# Patient Record
Sex: Female | Born: 1955 | Race: White | Hispanic: No | Smoking: Never smoker
Health system: Southern US, Community
[De-identification: ages and names within clinical notes are randomized; demographics above are authoritative.]

## PROBLEM LIST (undated history)

## (undated) DIAGNOSIS — K56609 Unspecified intestinal obstruction, unspecified as to partial versus complete obstruction: Secondary | ICD-10-CM

## (undated) DIAGNOSIS — K439 Ventral hernia without obstruction or gangrene: Secondary | ICD-10-CM

## (undated) DIAGNOSIS — E039 Hypothyroidism, unspecified: Secondary | ICD-10-CM

## (undated) DIAGNOSIS — E079 Disorder of thyroid, unspecified: Secondary | ICD-10-CM

## (undated) DIAGNOSIS — I10 Essential (primary) hypertension: Secondary | ICD-10-CM

## (undated) DIAGNOSIS — E669 Obesity, unspecified: Secondary | ICD-10-CM

## (undated) DIAGNOSIS — K219 Gastro-esophageal reflux disease without esophagitis: Secondary | ICD-10-CM

## (undated) HISTORY — PX: ABDOMINAL HYSTERECTOMY: SHX81

## (undated) HISTORY — DX: Obesity, unspecified: E66.9

## (undated) HISTORY — DX: Unspecified intestinal obstruction, unspecified as to partial versus complete obstruction: K56.609

## (undated) HISTORY — DX: Ventral hernia without obstruction or gangrene: K43.9

## (undated) HISTORY — DX: Gastro-esophageal reflux disease without esophagitis: K21.9

## (undated) HISTORY — DX: Disorder of thyroid, unspecified: E07.9

---
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
## 2006-07-30 ENCOUNTER — Ambulatory Visit: Payer: Self-pay | Admitting: Internal Medicine

## 2006-08-21 ENCOUNTER — Ambulatory Visit: Payer: Self-pay | Admitting: Unknown Physician Specialty

## 2006-08-28 ENCOUNTER — Ambulatory Visit: Payer: Self-pay | Admitting: Internal Medicine

## 2006-09-27 ENCOUNTER — Ambulatory Visit: Payer: Self-pay | Admitting: Internal Medicine

## 2006-10-07 ENCOUNTER — Ambulatory Visit: Payer: Self-pay | Admitting: Unknown Physician Specialty

## 2006-10-30 ENCOUNTER — Ambulatory Visit: Payer: Self-pay | Admitting: Internal Medicine

## 2006-11-28 ENCOUNTER — Ambulatory Visit: Payer: Self-pay | Admitting: Internal Medicine

## 2006-12-27 ENCOUNTER — Ambulatory Visit: Payer: Self-pay | Admitting: Internal Medicine

## 2011-06-13 ENCOUNTER — Ambulatory Visit: Payer: Self-pay

## 2014-08-02 DIAGNOSIS — R030 Elevated blood-pressure reading, without diagnosis of hypertension: Secondary | ICD-10-CM | POA: Insufficient documentation

## 2014-08-02 DIAGNOSIS — R7303 Prediabetes: Secondary | ICD-10-CM | POA: Insufficient documentation

## 2014-08-02 DIAGNOSIS — K219 Gastro-esophageal reflux disease without esophagitis: Secondary | ICD-10-CM | POA: Insufficient documentation

## 2014-09-27 ENCOUNTER — Ambulatory Visit: Payer: Self-pay | Admitting: Gastroenterology

## 2014-10-04 ENCOUNTER — Ambulatory Visit: Payer: Self-pay | Admitting: Gastroenterology

## 2014-10-12 DIAGNOSIS — R079 Chest pain, unspecified: Secondary | ICD-10-CM | POA: Insufficient documentation

## 2014-10-12 DIAGNOSIS — E782 Mixed hyperlipidemia: Secondary | ICD-10-CM | POA: Insufficient documentation

## 2014-10-12 DIAGNOSIS — I2510 Atherosclerotic heart disease of native coronary artery without angina pectoris: Secondary | ICD-10-CM | POA: Insufficient documentation

## 2014-12-01 DIAGNOSIS — F329 Major depressive disorder, single episode, unspecified: Secondary | ICD-10-CM | POA: Insufficient documentation

## 2014-12-01 DIAGNOSIS — E559 Vitamin D deficiency, unspecified: Secondary | ICD-10-CM | POA: Insufficient documentation

## 2014-12-01 DIAGNOSIS — F32A Depression, unspecified: Secondary | ICD-10-CM | POA: Insufficient documentation

## 2014-12-05 ENCOUNTER — Ambulatory Visit: Payer: Self-pay | Admitting: Internal Medicine

## 2015-02-20 LAB — SURGICAL PATHOLOGY

## 2015-12-07 ENCOUNTER — Other Ambulatory Visit: Payer: Self-pay | Admitting: Internal Medicine

## 2015-12-07 DIAGNOSIS — Z1231 Encounter for screening mammogram for malignant neoplasm of breast: Secondary | ICD-10-CM

## 2015-12-07 DIAGNOSIS — Z6841 Body Mass Index (BMI) 40.0 and over, adult: Secondary | ICD-10-CM | POA: Insufficient documentation

## 2015-12-18 ENCOUNTER — Ambulatory Visit
Admission: RE | Admit: 2015-12-18 | Discharge: 2015-12-18 | Disposition: A | Discharge status: Home / Self Care | Payer: Managed Care, Other (non HMO) | Source: Ambulatory Visit | Attending: Internal Medicine | Admitting: Internal Medicine

## 2015-12-18 DIAGNOSIS — Z1231 Encounter for screening mammogram for malignant neoplasm of breast: Secondary | ICD-10-CM | POA: Diagnosis present

## 2016-01-05 ENCOUNTER — Encounter: Payer: Self-pay | Admitting: Emergency Medicine

## 2016-01-05 ENCOUNTER — Other Ambulatory Visit: Payer: Self-pay

## 2016-01-05 ENCOUNTER — Inpatient Hospital Stay: Admit: 2016-01-05 | Payer: Self-pay | Admitting: Surgery

## 2016-01-05 ENCOUNTER — Encounter
Admission: EM | Disposition: A | Discharge status: Home / Self Care | Payer: Self-pay | Source: Home / Self Care | Attending: Surgery

## 2016-01-05 ENCOUNTER — Emergency Department: Payer: Managed Care, Other (non HMO) | Admitting: Anesthesiology

## 2016-01-05 ENCOUNTER — Emergency Department: Payer: Managed Care, Other (non HMO)

## 2016-01-05 ENCOUNTER — Ambulatory Visit (INDEPENDENT_AMBULATORY_CARE_PROVIDER_SITE_OTHER)
Admit: 2016-01-05 | Discharge: 2016-01-05 | Disposition: A | Discharge status: Home / Self Care | Payer: Managed Care, Other (non HMO) | Attending: Family Medicine | Admitting: Family Medicine

## 2016-01-05 ENCOUNTER — Ambulatory Visit (INDEPENDENT_AMBULATORY_CARE_PROVIDER_SITE_OTHER)
Admission: EM | Admit: 2016-01-05 | Discharge: 2016-01-05 | Disposition: A | Discharge status: Other Acute Inpatient Hospital | Payer: Managed Care, Other (non HMO) | Source: Home / Self Care | Attending: Family Medicine | Admitting: Family Medicine

## 2016-01-05 ENCOUNTER — Inpatient Hospital Stay
Admission: EM | Admit: 2016-01-05 | Discharge: 2016-01-08 | DRG: 354 | Disposition: A | Discharge status: Home / Self Care | Payer: Managed Care, Other (non HMO) | Attending: Surgery | Admitting: Surgery

## 2016-01-05 DIAGNOSIS — K56609 Unspecified intestinal obstruction, unspecified as to partial versus complete obstruction: Secondary | ICD-10-CM

## 2016-01-05 DIAGNOSIS — K5660 Unspecified intestinal obstruction: Secondary | ICD-10-CM

## 2016-01-05 DIAGNOSIS — Z803 Family history of malignant neoplasm of breast: Secondary | ICD-10-CM | POA: Diagnosis not present

## 2016-01-05 DIAGNOSIS — Z7982 Long term (current) use of aspirin: Secondary | ICD-10-CM

## 2016-01-05 DIAGNOSIS — E039 Hypothyroidism, unspecified: Secondary | ICD-10-CM | POA: Diagnosis present

## 2016-01-05 DIAGNOSIS — Z6841 Body Mass Index (BMI) 40.0 and over, adult: Secondary | ICD-10-CM | POA: Diagnosis not present

## 2016-01-05 DIAGNOSIS — K219 Gastro-esophageal reflux disease without esophagitis: Secondary | ICD-10-CM | POA: Diagnosis present

## 2016-01-05 DIAGNOSIS — K436 Other and unspecified ventral hernia with obstruction, without gangrene: Secondary | ICD-10-CM | POA: Diagnosis present

## 2016-01-05 DIAGNOSIS — R109 Unspecified abdominal pain: Secondary | ICD-10-CM

## 2016-01-05 DIAGNOSIS — K460 Unspecified abdominal hernia with obstruction, without gangrene: Secondary | ICD-10-CM

## 2016-01-05 DIAGNOSIS — F329 Major depressive disorder, single episode, unspecified: Secondary | ICD-10-CM | POA: Diagnosis present

## 2016-01-05 DIAGNOSIS — Z79899 Other long term (current) drug therapy: Secondary | ICD-10-CM

## 2016-01-05 DIAGNOSIS — K430 Incisional hernia with obstruction, without gangrene: Secondary | ICD-10-CM | POA: Diagnosis not present

## 2016-01-05 DIAGNOSIS — K660 Peritoneal adhesions (postprocedural) (postinfection): Secondary | ICD-10-CM | POA: Diagnosis present

## 2016-01-05 HISTORY — PX: LAPAROTOMY: SHX154

## 2016-01-05 HISTORY — PX: INGUINAL HERNIA REPAIR: SHX194

## 2016-01-05 LAB — CBC WITH DIFFERENTIAL/PLATELET
Basophils Absolute: 0.1 10*3/uL (ref 0–0.1)
Basophils Relative: 1 %
EOS PCT: 2 %
Eosinophils Absolute: 0.2 10*3/uL (ref 0–0.7)
HCT: 42.2 % (ref 35.0–47.0)
Hemoglobin: 13.5 g/dL (ref 12.0–16.0)
LYMPHS ABS: 1 10*3/uL (ref 1.0–3.6)
LYMPHS PCT: 9 %
MCH: 24 pg — AB (ref 26.0–34.0)
MCHC: 32 g/dL (ref 32.0–36.0)
MCV: 75.2 fL — AB (ref 80.0–100.0)
MONO ABS: 0.7 10*3/uL (ref 0.2–0.9)
Monocytes Relative: 6 %
Neutro Abs: 9.8 10*3/uL — ABNORMAL HIGH (ref 1.4–6.5)
Neutrophils Relative %: 82 %
PLATELETS: 269 10*3/uL (ref 150–440)
RBC: 5.61 MIL/uL — ABNORMAL HIGH (ref 3.80–5.20)
RDW: 18 % — AB (ref 11.5–14.5)
WBC: 11.8 10*3/uL — ABNORMAL HIGH (ref 3.6–11.0)

## 2016-01-05 LAB — AMYLASE: Amylase: 32 U/L (ref 28–100)

## 2016-01-05 LAB — URINALYSIS COMPLETE WITH MICROSCOPIC (ARMC ONLY)
GLUCOSE, UA: NEGATIVE mg/dL
Leukocytes, UA: NEGATIVE
Nitrite: NEGATIVE
PROTEIN: 30 mg/dL — AB
Specific Gravity, Urine: 1.02 (ref 1.005–1.030)
pH: 7.5 (ref 5.0–8.0)

## 2016-01-05 LAB — COMPREHENSIVE METABOLIC PANEL
ALK PHOS: 90 U/L (ref 38–126)
ALT: 19 U/L (ref 14–54)
AST: 18 U/L (ref 15–41)
Albumin: 4.2 g/dL (ref 3.5–5.0)
Anion gap: 7 (ref 5–15)
BUN: 12 mg/dL (ref 6–20)
CHLORIDE: 104 mmol/L (ref 101–111)
CO2: 26 mmol/L (ref 22–32)
CREATININE: 0.85 mg/dL (ref 0.44–1.00)
Calcium: 9.3 mg/dL (ref 8.9–10.3)
GFR calc Af Amer: >60 mL/min (ref >60)
Glucose, Bld: 111 mg/dL — ABNORMAL HIGH (ref 65–99)
Potassium: 4 mmol/L (ref 3.5–5.1)
SODIUM: 137 mmol/L (ref 135–145)
Total Bilirubin: 0.6 mg/dL (ref 0.3–1.2)
Total Protein: 8.1 g/dL (ref 6.5–8.1)

## 2016-01-05 LAB — LIPASE, BLOOD: Lipase: 12 U/L (ref 11–51)

## 2016-01-05 SURGERY — LAPAROTOMY, EXPLORATORY
Anesthesia: General

## 2016-01-05 MED ORDER — SUCCINYLCHOLINE CHLORIDE 20 MG/ML IJ SOLN
INTRAMUSCULAR | Status: DC | PRN
  Administered 2016-01-05: 120 mg via INTRAVENOUS

## 2016-01-05 MED ORDER — BUPIVACAINE-EPINEPHRINE 0.25% -1:200000 IJ SOLN
INTRAMUSCULAR | Status: DC | PRN
  Administered 2016-01-05: 30 mL

## 2016-01-05 MED ORDER — LEVOTHYROXINE SODIUM 100 MCG PO TABS
100.0000 ug | ORAL_TABLET | Freq: Every day | ORAL | Status: DC
  Administered 2016-01-06 – 2016-01-08 (×3): 100 ug via ORAL
  Filled 2016-01-05 (×4): qty 1

## 2016-01-05 MED ORDER — VITAMIN D (ERGOCALCIFEROL) 1.25 MG (50000 UNIT) PO CAPS: 50000.0000 [IU] | ORAL_CAPSULE | ORAL | Status: DC

## 2016-01-05 MED ORDER — FENTANYL CITRATE (PF) 100 MCG/2ML IJ SOLN
INTRAMUSCULAR | Status: DC | PRN
  Administered 2016-01-05: 100 ug via INTRAVENOUS
  Administered 2016-01-05 (×2): 50 ug via INTRAVENOUS
  Administered 2016-01-05: 100 ug via INTRAVENOUS
  Administered 2016-01-05: 50 ug via INTRAVENOUS

## 2016-01-05 MED ORDER — FENTANYL CITRATE (PF) 100 MCG/2ML IJ SOLN
50.0000 ug | Freq: Once | INTRAMUSCULAR | Status: AC
  Administered 2016-01-05: 50 ug via INTRAVENOUS
  Filled 2016-01-05: qty 2

## 2016-01-05 MED ORDER — BUPROPION HCL ER (SR) 150 MG PO TB12
150.0000 mg | ORAL_TABLET | Freq: Two times a day (BID) | ORAL | Status: DC
  Administered 2016-01-06 – 2016-01-08 (×5): 150 mg via ORAL
  Filled 2016-01-05 (×5): qty 1

## 2016-01-05 MED ORDER — LACTATED RINGERS IV SOLN
INTRAVENOUS | Status: DC
  Administered 2016-01-06 – 2016-01-07 (×6): via INTRAVENOUS

## 2016-01-05 MED ORDER — SUGAMMADEX SODIUM 200 MG/2ML IV SOLN
INTRAVENOUS | Status: DC | PRN
  Administered 2016-01-05: 199.6 mg via INTRAVENOUS

## 2016-01-05 MED ORDER — ASPIRIN EC 81 MG PO TBEC
81.0000 mg | DELAYED_RELEASE_TABLET | Freq: Every day | ORAL | Status: DC
  Administered 2016-01-06 – 2016-01-08 (×3): 81 mg via ORAL
  Filled 2016-01-05 (×3): qty 1

## 2016-01-05 MED ORDER — ADULT MULTIVITAMIN W/MINERALS CH
1.0000 | ORAL_TABLET | Freq: Every day | ORAL | Status: DC
  Administered 2016-01-06 – 2016-01-08 (×3): 1 via ORAL
  Filled 2016-01-05 (×3): qty 1

## 2016-01-05 MED ORDER — ONDANSETRON HCL 4 MG/2ML IJ SOLN: 4.0000 mg | Freq: Four times a day (QID) | INTRAMUSCULAR | Status: DC | PRN

## 2016-01-05 MED ORDER — DEXTROSE 5 % IV SOLN
2.0000 g | INTRAVENOUS | Status: DC | PRN
  Administered 2016-01-05: 2 g via INTRAVENOUS

## 2016-01-05 MED ORDER — ONDANSETRON HCL 4 MG/2ML IJ SOLN: 4.0000 mg | Freq: Once | INTRAMUSCULAR | Status: DC | PRN

## 2016-01-05 MED ORDER — ONDANSETRON HCL 4 MG/2ML IJ SOLN
4.0000 mg | Freq: Once | INTRAMUSCULAR | Status: AC
  Administered 2016-01-05: 4 mg via INTRAVENOUS
  Filled 2016-01-05: qty 2

## 2016-01-05 MED ORDER — ONDANSETRON 8 MG PO TBDP
8.0000 mg | ORAL_TABLET | Freq: Once | ORAL | Status: AC
  Administered 2016-01-05: 8 mg via ORAL

## 2016-01-05 MED ORDER — PROPOFOL 10 MG/ML IV BOLUS
INTRAVENOUS | Status: DC | PRN
  Administered 2016-01-05: 200 mg via INTRAVENOUS

## 2016-01-05 MED ORDER — LACTATED RINGERS IV SOLN
INTRAVENOUS | Status: DC | PRN
  Administered 2016-01-05 (×2): via INTRAVENOUS

## 2016-01-05 MED ORDER — FENTANYL CITRATE (PF) 100 MCG/2ML IJ SOLN
25.0000 ug | INTRAMUSCULAR | Status: DC | PRN
  Administered 2016-01-05 (×4): 25 ug via INTRAVENOUS

## 2016-01-05 MED ORDER — PHENYLEPHRINE HCL 10 MG/ML IJ SOLN
INTRAMUSCULAR | Status: DC | PRN
  Administered 2016-01-05: 100 ug via INTRAVENOUS

## 2016-01-05 MED ORDER — OXYCODONE HCL 5 MG PO TABS: 5.0000 mg | ORAL_TABLET | ORAL | Status: DC | PRN

## 2016-01-05 MED ORDER — PANTOPRAZOLE SODIUM 40 MG PO TBEC
40.0000 mg | DELAYED_RELEASE_TABLET | Freq: Every day | ORAL | Status: DC
  Administered 2016-01-06 – 2016-01-08 (×4): 40 mg via ORAL
  Filled 2016-01-05 (×4): qty 1

## 2016-01-05 MED ORDER — SODIUM CHLORIDE 0.9 % IV BOLUS (SEPSIS)
1000.0000 mL | Freq: Once | INTRAVENOUS | Status: AC
  Administered 2016-01-05: 1000 mL via INTRAVENOUS

## 2016-01-05 MED ORDER — MORPHINE SULFATE (PF) 4 MG/ML IV SOLN
4.0000 mg | Freq: Once | INTRAVENOUS | Status: AC | PRN
  Administered 2016-01-05: 4 mg via INTRAVENOUS
  Filled 2016-01-05: qty 1

## 2016-01-05 MED ORDER — ROCURONIUM BROMIDE 100 MG/10ML IV SOLN
INTRAVENOUS | Status: DC | PRN
  Administered 2016-01-05: 40 mg via INTRAVENOUS
  Administered 2016-01-05: 20 mg via INTRAVENOUS
  Administered 2016-01-05: 10 mg via INTRAVENOUS

## 2016-01-05 MED ORDER — LIDOCAINE HCL (CARDIAC) 20 MG/ML IV SOLN
INTRAVENOUS | Status: DC | PRN
  Administered 2016-01-05: 60 mg via INTRAVENOUS

## 2016-01-05 MED ORDER — ONDANSETRON HCL 4 MG/2ML IJ SOLN
INTRAMUSCULAR | Status: AC
  Administered 2016-01-05: 4 mg via INTRAVENOUS
  Filled 2016-01-05: qty 2

## 2016-01-05 MED ORDER — IOHEXOL 350 MG/ML SOLN
125.0000 mL | Freq: Once | INTRAVENOUS | Status: AC | PRN
  Administered 2016-01-05: 125 mL via INTRAVENOUS

## 2016-01-05 MED ORDER — HEPARIN SODIUM (PORCINE) 5000 UNIT/ML IJ SOLN
5000.0000 [IU] | Freq: Three times a day (TID) | INTRAMUSCULAR | Status: DC
  Administered 2016-01-06 – 2016-01-08 (×7): 5000 [IU] via SUBCUTANEOUS
  Filled 2016-01-05 (×7): qty 1

## 2016-01-05 MED ORDER — ONDANSETRON HCL 4 MG PO TABS
4.0000 mg | ORAL_TABLET | Freq: Four times a day (QID) | ORAL | Status: DC | PRN
  Administered 2016-01-06: 4 mg via ORAL
  Filled 2016-01-05: qty 1

## 2016-01-05 MED ORDER — ONDANSETRON HCL 4 MG/2ML IJ SOLN
INTRAMUSCULAR | Status: DC | PRN
  Administered 2016-01-05: 10 mg via INTRAVENOUS

## 2016-01-05 MED ORDER — ONDANSETRON HCL 4 MG/2ML IJ SOLN
4.0000 mg | Freq: Once | INTRAMUSCULAR | Status: AC
  Administered 2016-01-05: 4 mg via INTRAVENOUS

## 2016-01-05 MED ORDER — BUPIVACAINE-EPINEPHRINE (PF) 0.25% -1:200000 IJ SOLN
INTRAMUSCULAR | Status: AC
  Filled 2016-01-05: qty 30

## 2016-01-05 MED ORDER — HYDROMORPHONE HCL 1 MG/ML IJ SOLN
1.0000 mg | INTRAMUSCULAR | Status: DC | PRN
  Administered 2016-01-05 – 2016-01-06 (×2): 1 mg via INTRAVENOUS
  Filled 2016-01-05 (×2): qty 1

## 2016-01-05 SURGICAL SUPPLY — 31 items
CANISTER SUCT 1200ML W/VALVE (MISCELLANEOUS) ×4 IMPLANT
CATH TRAY 16F METER LATEX (MISCELLANEOUS) ×4 IMPLANT
CHLORAPREP W/TINT 26ML (MISCELLANEOUS) ×8 IMPLANT
DRAPE LAPAROTOMY 100X77 ABD (DRAPES) ×4 IMPLANT
DRSG TELFA 3X8 NADH (GAUZE/BANDAGES/DRESSINGS) ×4 IMPLANT
ELECT REM PT RETURN 9FT ADLT (ELECTROSURGICAL) ×4
ELECTRODE REM PT RTRN 9FT ADLT (ELECTROSURGICAL) ×2 IMPLANT
GAUZE SPONGE 4X4 12PLY STRL (GAUZE/BANDAGES/DRESSINGS) ×4 IMPLANT
GLOVE BIO SURGEON STRL SZ8 (GLOVE) ×24 IMPLANT
GOWN STRL REUS W/ TWL LRG LVL3 (GOWN DISPOSABLE) ×4 IMPLANT
GOWN STRL REUS W/TWL LRG LVL3 (GOWN DISPOSABLE) ×4
KIT RM TURNOVER STRD PROC AR (KITS) ×4 IMPLANT
LABEL OR SOLS (LABEL) ×4 IMPLANT
NDL SAFETY 22GX1.5 (NEEDLE) ×4 IMPLANT
NS IRRIG 1000ML POUR BTL (IV SOLUTION) ×4 IMPLANT
PACK BASIN MAJOR ARMC (MISCELLANEOUS) ×4 IMPLANT
PACK COLON CLEAN CLOSURE (MISCELLANEOUS) ×4 IMPLANT
SEPRAFILM MEMBRANE 5X6 (MISCELLANEOUS) ×4 IMPLANT
SPONGE LAP 18X18 5 PK (GAUZE/BANDAGES/DRESSINGS) ×4 IMPLANT
STAPLER SKIN PROX 35W (STAPLE) ×4 IMPLANT
SUT PDS AB 1 TP1 54 (SUTURE) ×4 IMPLANT
SUT PROLENE 0 CT 1 30 (SUTURE) ×12 IMPLANT
SUT PROLENE 0 CTX CR/8 (SUTURE) ×8 IMPLANT
SUT SILK 0 SH 30 (SUTURE) ×4 IMPLANT
SUT SILK 3-0 (SUTURE) IMPLANT
SUT VIC AB 0 CT1 36 (SUTURE) ×4 IMPLANT
SUT VIC AB 3-0 SH 27 (SUTURE)
SUT VIC AB 3-0 SH 27X BRD (SUTURE) IMPLANT
SUT VICRYL 2 0 18  UND BR (SUTURE)
SUT VICRYL 2 0 18 UND BR (SUTURE) IMPLANT
SYRINGE 10CC LL (SYRINGE) ×8 IMPLANT

## 2016-01-05 NOTE — Anesthesia Preprocedure Evaluation (Addendum)
Anesthesia Evaluation  Patient identified by MRN, date of birth, ID band Patient awake    Reviewed: Allergy & Precautions, NPO status , Patient's Chart, lab work & pertinent test results  Airway Mallampati: III       Dental  (+) Teeth Intact   Pulmonary neg pulmonary ROS,    breath sounds clear to auscultation       Cardiovascular Exercise Tolerance: Good  Rhythm:Regular Rate:Normal     Neuro/Psych negative neurological ROS     GI/Hepatic Neg liver ROS, GERD  Medicated,  Endo/Other  Hypothyroidism Morbid obesity  Renal/GU negative Renal ROS     Musculoskeletal negative musculoskeletal ROS (+)   Abdominal (+) + obese,   Peds  Hematology negative hematology ROS (+)   Anesthesia Other Findings   Reproductive/Obstetrics                            Anesthesia Physical Anesthesia Plan  ASA: II and emergent  Anesthesia Plan: General   Post-op Pain Management:    Induction: Intravenous  Airway Management Planned: Oral ETT  Additional Equipment:   Intra-op Plan:   Post-operative Plan: Extubation in OR  Informed Consent: I have reviewed the patients History and Physical, chart, labs and discussed the procedure including the risks, benefits and alternatives for the proposed anesthesia with the patient or authorized representative who has indicated his/her understanding and acceptance.     Plan Discussed with: CRNA  Anesthesia Plan Comments:         Anesthesia Quick Evaluation

## 2016-01-05 NOTE — Op Note (Signed)
Abdominal Hernia Repair  Pre-operative Diagnosis: Incarcerated ventral hernia with bowel obstruction  Post-operative Diagnosis: Same  Surgeon: Adah Salvageichard E. Excell Seltzerooper, MD FACS  Anesthesia: Gen. with endotracheal tube  Assistant: Surgical tech  Procedure: Exploratory laparotomy and repair of incarcerated ventral hernia primarily  Procedure Details patient was admitted from the emergency room with an incarcerated ventral hernia The patient was seen again in the Holding Room. The benefits, complications, treatment options, and expected outcomes were discussed with the patient. The risks of bleeding, infection, recurrence of symptoms, failure to resolve symptoms, bowel injury, mesh placement, mesh infection, any of which could require further surgery were reviewed with the patient. I discussed in great detail the high risk of mesh infection if mesh is utilized and the potential for primary closure with its risk of recurrence versus bio mesh U utilization The likelihood of improving the patient's symptoms with return to their baseline status is good.  The patient and/or family concurred with the proposed plan, giving informed consent.  The patient was taken to Operating Room, identified as Bridget Williams and the procedure verified.  A Time Out was held and the above information confirmed.  Prior to the induction of general anesthesia, antibiotic prophylaxis was administered. VTE prophylaxis was in place. General endotracheal anesthesia was then administered and tolerated well. After the induction, the abdomen was prepped with Chloraprep and draped in the sterile fashion. The patient was positioned in the supine position.  A midline incision was utilized to open and explore the subcutaneous tissues tissues a very large hernia sac was identified and it was opened showing viable colon and viable omentum. This was chronically incarcerated in these incarcerations were taken down with electrocautery. Due to the  very small size of the ventral hernia which was ultimately sized at 2-2-1/2 cm, there was tremendous hypertension in the venous structures of the omentum this required suturing with 0 silk suture ligatures or silk ties for control of hemorrhage and the omentum. Because of the very small rent that was identified this went required enlargement both cephalad and caudad for a great distance in order to reduce the large amount of colon and omentum. The colon was filled with constipated stool balls signifying a very chronic condition. Once this was complete it was noted that the venous hypertension of the omentum had been relieved being placed back in the abdomen and all bleeding stopped. However at this point there was probably 150-200 cc of blood loss from the omental bleeding venous structures that required suturing  It was decided that because the hernia rent itself was so small and because of the risk of infection the wound would be closed primarily out the use of prosthetic mesh. This is done by once assuring that hemostasis was adequate and the sponge lap needle count was correct at the Seprafilm was placed understanding that there was a higher risk of recurrence and reoperation. The wound was then closed with figure-of-eight 0 Prolene sutures tied individually adequately repaired the hernia and closed the fascial edges.  Marcaine was infiltrated into the skin and subcutaneous tissues for a total of 30 cc and then a deep running suture in 2 layers of 20 and 0 Vicryl was placed. Skin staples were placed a sterile dressing was utilized.  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 Excell Seltzer, MD, FACS

## 2016-01-05 NOTE — H&P (Signed)
Bridget Williams is an 60 y.o. female.    Chief Complaint: abd pain  HPI: abd pain, periumb with N/V, no BM since Tuesday. Pain started Tuesday, unable to work No prior episode; has had lap assist hysterectomy  Has thyroid disease and GERD  History reviewed. No pertinent past medical history.  Past Surgical History  Procedure Laterality Date  . Abdominal hysterectomy    . Breast biopsy Bilateral 1978    neg    Family History  Problem Relation Age of Onset  . Breast cancer Mother 48  . Breast cancer Paternal Aunt    Social History:  reports that she has never smoked. She does not have any smokeless tobacco history on file. She reports that she does not drink alcohol. Her drug history is not on file. Works at [employer] Allergies: No Known Allergies   (Not in a hospital admission)   Review of Systems  Constitutional: Negative for fever, chills and weight loss.  HENT: Negative.   Eyes: Negative.   Respiratory: Negative.   Cardiovascular: Negative.   Gastrointestinal: Positive for nausea, vomiting, abdominal pain and constipation. Negative for heartburn, diarrhea, blood in stool and melena.  Genitourinary: Negative.   Musculoskeletal: Negative.   Skin: Negative.   Neurological: Negative.   Endo/Heme/Allergies: Negative.   Psychiatric/Behavioral: Negative.      Physical Exam:  BP 158/93 mmHg  Pulse 98  Temp(Src) 98.3 F (36.8 C) (Oral)  Resp 19  Ht 5' (1.524 m)  Wt 220 lb (99.791 kg)  BMI 42.97 kg/m2  SpO2 97%  Physical Exam  Constitutional: She is oriented to person, place, and time. She appears distressed.  Morbidly obese comfortable-appearing  HENT:  Head: Normocephalic and atraumatic.  Mouth/Throat: No oropharyngeal exudate.  Eyes: Pupils are equal, round, and reactive to light. Right eye exhibits no discharge. Left eye exhibits no discharge. No scleral icterus.  Neck: Normal range of motion. Neck supple.  Cardiovascular: Normal rate, regular rhythm  and normal heart sounds.   No murmur heard. Pulmonary/Chest: Effort normal and breath sounds normal. No respiratory distress. She has no wheezes. She has no rales.  Abdominal: Soft. She exhibits distension and mass. There is tenderness. There is guarding. There is no rebound.  Bluish tinted skin over umbilical or ventral hernia with incarceration the area is very tender with minimal percussion tenderness but some guarding  Musculoskeletal: Normal range of motion. She exhibits edema. She exhibits no tenderness.  Lymphadenopathy:    She has no cervical adenopathy.  Neurological: She is alert and oriented to person, place, and time.  Skin: Skin is warm and dry. No rash noted. She is not diaphoretic. No erythema.  Psychiatric: Mood and affect normal.  Vitals reviewed.       Results for orders placed or performed during the hospital encounter of 01/05/16 (from the past 48 hour(s))  CBC with Differential     Status: Abnormal   Collection Time: 01/05/16  1:00 PM  Result Value Ref Range   WBC 11.8 (H) 3.6 - 11.0 K/uL   RBC 5.61 (H) 3.80 - 5.20 MIL/uL   Hemoglobin 13.5 12.0 - 16.0 g/dL   HCT 42.2 35.0 - 47.0 %   MCV 75.2 (L) 80.0 - 100.0 fL   MCH 24.0 (L) 26.0 - 34.0 pg   MCHC 32.0 32.0 - 36.0 g/dL   RDW 18.0 (H) 11.5 - 14.5 %   Platelets 269 150 - 440 K/uL   Neutrophils Relative % 82 %   Neutro Abs 9.8 (  H) 1.4 - 6.5 K/uL   Lymphocytes Relative 9 %   Lymphs Abs 1.0 1.0 - 3.6 K/uL   Monocytes Relative 6 %   Monocytes Absolute 0.7 0.2 - 0.9 K/uL   Eosinophils Relative 2 %   Eosinophils Absolute 0.2 0 - 0.7 K/uL   Basophils Relative 1 %   Basophils Absolute 0.1 0 - 0.1 K/uL  Amylase     Status: None   Collection Time: 01/05/16  1:00 PM  Result Value Ref Range   Amylase 32 28 - 100 U/L  Lipase, blood     Status: None   Collection Time: 01/05/16  1:00 PM  Result Value Ref Range   Lipase 12 11 - 51 U/L  Urinalysis complete, with microscopic     Status: Abnormal   Collection Time:  01/05/16  1:00 PM  Result Value Ref Range   Color, Urine YELLOW YELLOW   APPearance HAZY (A) CLEAR   Glucose, UA NEGATIVE NEGATIVE mg/dL   Bilirubin Urine 1+ (A) NEGATIVE   Ketones, ur 2+ (A) NEGATIVE mg/dL   Specific Gravity, Urine 1.020 1.005 - 1.030   Hgb urine dipstick TRACE (A) NEGATIVE   pH 7.5 5.0 - 8.0   Protein, ur 30 (A) NEGATIVE mg/dL   Nitrite NEGATIVE NEGATIVE   Leukocytes, UA NEGATIVE NEGATIVE   RBC / HPF 0-5 0 - 5 RBC/hpf   WBC, UA 0-5 0 - 5 WBC/hpf   Bacteria, UA RARE (A) NONE SEEN   Squamous Epithelial / LPF 0-5 (A) NONE SEEN   Urine-Other YEAST   Comprehensive metabolic panel     Status: Abnormal   Collection Time: 01/05/16  1:00 PM  Result Value Ref Range   Sodium 137 135 - 145 mmol/L   Potassium 4.0 3.5 - 5.1 mmol/L   Chloride 104 101 - 111 mmol/L   CO2 26 22 - 32 mmol/L   Glucose, Bld 111 (H) 65 - 99 mg/dL   BUN 12 6 - 20 mg/dL   Creatinine, Ser 0.85 0.44 - 1.00 mg/dL   Calcium 9.3 8.9 - 10.3 mg/dL   Total Protein 8.1 6.5 - 8.1 g/dL   Albumin 4.2 3.5 - 5.0 g/dL   AST 18 15 - 41 U/L   ALT 19 14 - 54 U/L   Alkaline Phosphatase 90 38 - 126 U/L   Total Bilirubin 0.6 0.3 - 1.2 mg/dL   GFR calc non Af Amer >60 >60 mL/min   GFR calc Af Amer >60 >60 mL/min    Comment: (NOTE) The eGFR has been calculated using the CKD EPI equation. This calculation has not been validated in all clinical situations. eGFR's persistently <60 mL/min signify possible Chronic Kidney Disease.    Anion gap 7 5 - 15   Dg Chest 1 View  01/05/2016  CLINICAL DATA:  Mid abdominal pain EXAM: CHEST 1 VIEW COMPARISON:  Partial comparison is CT abdomen pelvis dated 01/05/2016 FINDINGS: Mild left basilar atelectasis, better evaluated on CT. No focal consolidation. No pleural effusion or pneumothorax. The heart is normal in size. Moderate hiatal hernia partially obscuring the left lung base. IMPRESSION: No evidence of acute cardiopulmonary disease. Electronically Signed   By: Julian Hy  M.D.   On: 01/05/2016 19:21   Ct Abdomen Pelvis W Contrast  01/05/2016  CLINICAL DATA:  Mid abdominal pain which began 3 days ago, personal history of large abdominal hernia EXAM: CT ABDOMEN AND PELVIS WITH CONTRAST TECHNIQUE: Multidetector CT imaging of the abdomen and pelvis was performed using  the standard protocol following bolus administration of intravenous contrast. CONTRAST:  180m OMNIPAQUE IOHEXOL 350 MG/ML SOLN COMPARISON:  09/27/2014 FINDINGS: Lower chest: Mild dependent atelectasis bilaterally. Large hiatal hernia. Hepatobiliary: Mild diffuse hepatic steatosis. Numerous calcifications project over the gallbladder including what appears to be a calcified stone measuring 15 mm. No biliary dilatation appreciated. Pancreas: Pancreas is normal Spleen: Spleen is normal Adrenals/Urinary Tract: Adrenal glands are normal. Kidneys are normal. No hydronephrosis. Bladder is normal. Stomach/Bowel: There is a periumbilical hernia with opening measuring about 5 cm. The contents of the hernia sac are not entirely visualized due to limitations regarding patient body habitus. The hernia sac contains transverse colon. There is mild inflammatory change where a fair into an E fair it loops cross the abdominal wall. There is mild wall thickening of the proximal loop. There is gas distal to the hernia, but it is also noted that the caliber of proximal large bowel is notably larger than that of distal large bowel. Proximal colon measures 7 cm, while colon distal to the hernia measures no more than 3 cm with descending and sigmoid colon measuring only about 1.5 cm. Appendix appears normal. Small bowel is normal. Vascular/Lymphatic: No vascular abnormalities. No significant adenopathy. Reproductive: Reproductive organs not visualized.  No pelvic masses. Other: No free fluid in the abdomen or pelvis. Musculoskeletal: No acute musculoskeletal abnormalities. IMPRESSION: Findings are concerning for an element of obstruction of  the large bowel related to anterior abdominal wall hernia involving transverse colon. There is also evidence to suggest strangulation. Critical Value/emergent results were called by telephone at the time of interpretation on 01/05/2016 at 4:35 pm to Dr. EFrederich Cha, who verbally acknowledged these results. Electronically Signed   By: RSkipper ClicheM.D.   On: 01/05/2016 16:35     Assessment/Plan  Incarcerated ventral hernia with bowel present. This been going on for several days and my concern is for ischemic bowel. She requires exploration with repair of incarcerated ventral hernia and possible bowel resection and possible ostomy. I discussed with her the inability to utilize standard plastic meshes and the potential for primary repair with its high risk of recurrence versus the risk of infection with plastic mesh versus the suboptimal alternative choices of biologic meshes. The risk of bleeding infection mesh placement mesh infection recurrent hernia bowel resection and anastomotic leak and possible colostomy or ileostomy were all reviewed with her she understood and agreed to proceed no family was present just me to call her husband postoperatively.  RFlorene Glen MD, FACS

## 2016-01-05 NOTE — Discharge Instructions (Signed)

## 2016-01-05 NOTE — ED Notes (Addendum)
Patient c/o pain in the middle of her abdomen that started Tuesday night.  Patient reports not having a bowel movement since Tuesday.  Patient denies fevers.  Patient reports nausea.  Patient denies V/D.

## 2016-01-05 NOTE — Anesthesia Procedure Notes (Signed)
Procedure Name: Intubation Date/Time: 01/05/2016 9:05 PM Performed by: Junious SilkNOLES, Jaston Havens Pre-anesthesia Checklist: Patient identified, Patient being monitored, Timeout performed, Emergency Drugs available and Suction available Patient Re-evaluated:Patient Re-evaluated prior to inductionOxygen Delivery Method: Circle system utilized Preoxygenation: Pre-oxygenation with 100% oxygen Intubation Type: IV induction Ventilation: Mask ventilation without difficulty Laryngoscope Size: 3 and McGraph Grade View: Grade IV Tube type: Oral Tube size: 7.0 mm Number of attempts: 1 Airway Equipment and Method: Stylet Placement Confirmation: ETT inserted through vocal cords under direct vision,  positive ETCO2 and breath sounds checked- equal and bilateral Secured at: 21 cm Tube secured with: Tape Dental Injury: Teeth and Oropharynx as per pre-operative assessment

## 2016-01-05 NOTE — Transfer of Care (Signed)
Immediate Anesthesia Transfer of Care Note  Patient: Bridget Williams  Procedure(s) Performed: Procedure(s): EXPLORATORY LAPAROTOMY (N/A) HERNIA REPAIR INGUINAL INCARCERATED  Patient Location: PACU  Anesthesia Type:General  Level of Consciousness: sedated  Airway & Oxygen Therapy: Patient Spontanous Breathing and Patient connected to face mask oxygen  Post-op Assessment: Report given to RN and Post -op Vital signs reviewed and stable  Post vital signs: Reviewed and stable  Last Vitals:  Filed Vitals:   01/05/16 1930 01/05/16 2000  BP: 158/93 160/73  Pulse: 98 103  Temp:    Resp: 19 14    Complications: No apparent anesthesia complications

## 2016-01-05 NOTE — ED Notes (Signed)
[facility]

## 2016-01-05 NOTE — ED Notes (Signed)
CT scan has been approved.  Case # [id number]  Authorization # [id number]

## 2016-01-05 NOTE — ED Notes (Signed)
[facility] [place] [facility] [place] [facility] [place] [facility] sent [place] due to bowel blockage.    C/O abdominal pain and vomiting.  Onset of pain Tuesday night worsening last night.

## 2016-01-05 NOTE — ED Provider Notes (Signed)
[facility] Emergency Department Provider Note   ____________________________________________  Time seen: Approximately 6:45 PM I have reviewed the triage vital signs and the triage nursing note.  HISTORY  Chief Complaint Abdominal Pain   Historian Patient  HPI Bridget Williams is a 60 y.o. female with a history of abdominal hysterectomy and an umbilical hernia, who is here from urgent care with a CT positive for large bowel obstruction with evidence of streaking or induration. Patient states her pain started on Tuesday, she did have a small bowel movement on Tuesday as well, then the pain got worse over Wednesday with abdominal swelling. Pain was worse until she saw urgent care today and had a CT scan showing the bowel surgeon. She's never had a bowel obstruction before. She is not had any trouble breathing. No chest pain. No fever.Positive for nausea and vomiting.    History reviewed. No pertinent past medical history.  There are no active problems to display for this patient.   Past Surgical History  Procedure Laterality Date  . Abdominal hysterectomy    . Breast biopsy Bilateral 1978    neg    Current Outpatient Rx  Name  Route  Sig  Dispense  Refill  . acetaminophen (TYLENOL) 500 MG tablet   Oral   Take 1,000 mg by mouth every 6 (six) hours as needed for mild pain or headache.         Marland Kitchen aspirin EC 81 MG tablet   Oral   Take 81 mg by mouth daily.         Marland Kitchen buPROPion (WELLBUTRIN SR) 150 MG 12 hr tablet   Oral   Take 150 mg by mouth 2 (two) times daily.         Marland Kitchen levothyroxine (SYNTHROID, LEVOTHROID) 100 MCG tablet   Oral   Take 100 mcg by mouth daily before breakfast.         . Multiple Vitamin (MULTIVITAMIN WITH MINERALS) TABS tablet   Oral   Take 1 tablet by mouth daily.         . pantoprazole (PROTONIX) 40 MG tablet   Oral   Take 40 mg by mouth daily.         . Vitamin D, Ergocalciferol, (DRISDOL) 50000 units CAPS  capsule   Oral   Take 50,000 Units by mouth every 30 (thirty) days. Pt takes on the 1st of every month.           Allergies Review of patient's allergies indicates no known allergies.  Family History  Problem Relation Age of Onset  . Breast cancer Mother 33  . Breast cancer Paternal Aunt     Social History Social History  Substance Use Topics  . Smoking status: Never Smoker   . Smokeless tobacco: None  . Alcohol Use: No    Review of Systems  Constitutional: Negative for fever. Eyes: Negative for visual changes. ENT: Negative for sore throat. Cardiovascular: Negative for chest pain. Respiratory: Negative for shortness of breath. Gastrointestinal: Negative for diarrhea. Genitourinary: Negative for dysuria. Musculoskeletal: Negative for back pain. Skin: Negative for rash. Neurological: Negative for headache. 10 point Review of Systems otherwise negative ____________________________________________   PHYSICAL EXAM:  VITAL SIGNS: ED Triage Vitals  Enc Vitals Group     BP 01/05/16 1842 126/81 mmHg     Pulse Rate 01/05/16 1820 102     Resp 01/05/16 1820 16     Temp 01/05/16 1820 98.1 F (36.7 C)  Temp Source 01/05/16 1820 Oral     SpO2 01/05/16 1820 98 %     Weight 01/05/16 1820 220 lb (99.791 kg)     Height 01/05/16 1820 5' (1.524 m)     Head Cir --      Peak Flow --      Pain Score 01/05/16 1821 10     Pain Loc --      Pain Edu? --      Excl. in GC? --      Constitutional: Alert and oriented. Well appearing and in no distress. HEENT   Head: Normocephalic and atraumatic.      Eyes: Conjunctivae are normal. PERRL. Normal extraocular movements.      Ears:         Nose: No congestion/rhinnorhea.   Mouth/Throat: Mucous membranes are moist.   Neck: No stridor. Cardiovascular/Chest: Normal rate, regular rhythm.  No murmurs, rubs, or gallops. Respiratory: Normal respiratory effort without tachypnea nor retractions. Breath sounds are clear and  equal bilaterally. No wheezes/rales/rhonchi. Gastrointestinal: Soft. Distended with tenderness to palpation diffusely especially over the umbilical hernia area. Genitourinary/rectal:Deferred Musculoskeletal: Nontender with normal range of motion in all extremities. No joint effusions.  No lower extremity tenderness.  No edema. Neurologic:  Normal speech and language. No gross or focal neurologic deficits are appreciated. Skin:  Skin is warm, dry and intact. No rash noted. Psychiatric: Mood and affect are normal. Speech and behavior are normal. Patient exhibits appropriate insight and judgment.  ____________________________________________   EKG I, Governor Rooksebecca Andry Bogden, MD, the attending physician have personally viewed and interpreted all ECGs.  103 bpm. Sinus tachycardia. Narrow QRS. Normal axis. Nonspecific ST and T-wave ____________________________________________  LABS (pertinent positives/negatives)  Review of labs show a white blood cell count 11.8, electrolytes within normal limits. Kidney function normal.  ____________________________________________  RADIOLOGY All Xrays were viewed by me. Imaging interpreted by Radiologist.  CT abdomen and pelvis:IMPRESSION: Findings are concerning for an element of obstruction of the large bowel related to anterior abdominal wall hernia involving transverse colon. There is also evidence to suggest strangulation __________________________________________  PROCEDURES  Procedure(s) performed: None  Critical Care performed: None  ____________________________________________   ED COURSE / ASSESSMENT AND PLAN  Pertinent labs & imaging results that were available during my care of the patient were reviewed by me and considered in my medical decision making (see chart for details).   Patient slightly tachycardic and didn't pain, but reports much better pain control after receiving fentanyl. No hypertension. No fever. Patient with clinical  history consistent with bowel surgeon since Tuesday, and CT showing large bowel obstruction with likely evidence of strength relation.     CONSULTATIONS: General surgeon, Dr. Excell Seltzerooper, will take the patient to surgery.   Patient / Family / Caregiver informed of clinical course, medical decision-making process, and agree with plan.     ___________________________________________   FINAL CLINICAL IMPRESSION(S) / ED DIAGNOSES   Final diagnoses:  Large bowel obstruction (HCC)              Note: This dictation was prepared with Dragon dictation. Any transcriptional errors that result from this process are unintentional   Governor Rooksebecca Yun Gutierrez, MD 01/05/16 1945

## 2016-01-05 NOTE — ED Provider Notes (Signed)
CSN: [id number]     Arrival date & time 01/05/16  1202 History   First MD Initiated Contact with Patient 01/05/16 1228    Nurses notes were reviewed.  Chief Complaint  Patient presents with  . Abdominal Pain     Patient's here because of abdominal pain. Abdominal pain started 4 days ago and has progressed. She states that Tuesday evening she started having abdominal pain she's had nausea and vomiting. Now she states is a 9-10 out of 10 pain. Should be noted that in November 2016 she had a colonoscopy but they had to stop because of obstruction. CT scan contrast that time showed that she had marked adhesions and the GI doctor told her that they probably to remove the adhesions would require a significant abdominal surgery. She has had a hysterectomy and dysfunction is that she has scarring from the hysterectomy.  She does not smoke but her mother did have breast cancer in a paternal aunt had breast cancer.  He has history of hypothyroidism reflux and depression. (Consider location/radiation/quality/duration/timing/severity/associated sxs/prior Treatment) Patient is a 60 y.o. female presenting with abdominal pain. The history is provided by the patient. No language interpreter was used.  Abdominal Pain Pain location:  Epigastric and suprapubic Pain quality: cramping, gnawing, sharp and shooting   Pain radiates to:  Does not radiate Pain severity:  Severe Onset quality:  Sudden Timing:  Constant Progression:  Worsening Context: previous surgery   Relieved by:  Nothing Ineffective treatments:  None tried Associated symptoms: constipation, nausea and vomiting   Risk factors: multiple surgeries and obesity     History reviewed. No pertinent past medical history. Past Surgical History  Procedure Laterality Date  . Abdominal hysterectomy    . Breast biopsy Bilateral 1978    neg   Family History  Problem Relation Age of Onset  . Breast cancer Mother 3782  . Breast cancer Paternal Aunt      Social History  Substance Use Topics  . Smoking status: Never Smoker   . Smokeless tobacco: None  . Alcohol Use: No   OB History    No data available     Review of Systems  Gastrointestinal: Positive for nausea, vomiting, abdominal pain, constipation and abdominal distention.  All other systems reviewed and are negative.   Allergies  Review of patient's allergies indicates no known allergies.  Home Medications   Prior to Admission medications   Medication Sig Start Date End Date Taking? Authorizing Provider  buPROPion (WELLBUTRIN SR) 150 MG 12 hr tablet Take 150 mg by mouth 2 (two) times daily.   Yes Historical Provider, MD  levothyroxine (SYNTHROID, LEVOTHROID) 100 MCG tablet Take 100 mcg by mouth daily before breakfast.   Yes Historical Provider, MD  pantoprazole (PROTONIX) 40 MG tablet Take 40 mg by mouth daily.   Yes Historical Provider, MD   Meds Ordered and Administered this Visit   Medications  ondansetron (ZOFRAN-ODT) disintegrating tablet 8 mg (8 mg Oral Given 01/05/16 1337)    BP 164/92 mmHg  Pulse 101  Temp(Src) 98.8 F (37.1 C) (Tympanic)  Resp 16  Ht 5' (1.524 m)  Wt 220 lb (99.791 kg)  BMI 42.97 kg/m2  SpO2 99% No data found.   Physical Exam  Constitutional: She is oriented to person, place, and time. She appears well-developed and well-nourished. She appears distressed.  HENT:  Head: Normocephalic and atraumatic.  Eyes: Conjunctivae are normal. Pupils are equal, round, and reactive to light.  Neck: Normal range of motion. Neck  supple. No tracheal deviation present.  Cardiovascular: Normal rate and regular rhythm.   Pulmonary/Chest: Effort normal.  Abdominal: She exhibits distension. Bowel sounds are absent. There is no hepatosplenomegaly. There is tenderness in the periumbilical area. There is no CVA tenderness. A hernia is present. Hernia confirmed positive in the ventral area.    Suspicious mass inferior to the umbilicus and swelling as well  with this venous distention over the umbilicus. This definitely umbilical hernia bowel sounds are hyperactive and she has diffuse tenderness over the area as well  Musculoskeletal: Normal range of motion.  Neurological: She is alert and oriented to person, place, and time.  Skin: Skin is warm and dry. No erythema.  Psychiatric: She has a normal mood and affect.  Vitals reviewed.   ED Course  Procedures (including critical care time)  Labs Review Labs Reviewed  CBC WITH DIFFERENTIAL/PLATELET - Abnormal; Notable for the following:    WBC 11.8 (*)    RBC 5.61 (*)    MCV 75.2 (*)    MCH 24.0 (*)    RDW 18.0 (*)    Neutro Abs 9.8 (*)    All other components within normal limits  URINALYSIS COMPLETEWITH MICROSCOPIC ([facility] ONLY) - Abnormal; Notable for the following:    APPearance HAZY (*)    Bilirubin Urine 1+ (*)    Ketones, ur 2+ (*)    Hgb urine dipstick TRACE (*)    Protein, ur 30 (*)    Bacteria, UA RARE (*)    Squamous Epithelial / LPF 0-5 (*)    All other components within normal limits  COMPREHENSIVE METABOLIC PANEL - Abnormal; Notable for the following:    Glucose, Bld 111 (*)    All other components within normal limits  AMYLASE  LIPASE, BLOOD    Imaging Review Ct Abdomen Pelvis W Contrast  01/05/2016  CLINICAL DATA:  Mid abdominal pain which began 3 days ago, personal history of large abdominal hernia EXAM: CT ABDOMEN AND PELVIS WITH CONTRAST TECHNIQUE: Multidetector CT imaging of the abdomen and pelvis was performed using the standard protocol following bolus administration of intravenous contrast. CONTRAST:  OMNIPAQUE IOHEXOL 350 MG/ML SOLN COMPARISON:  09/27/2014 FINDINGS: Lower chest: Mild dependent atelectasis bilaterally. Large hiatal hernia. Hepatobiliary: Mild diffuse hepatic steatosis. Numerous calcifications project over the gallbladder including what appears to be a calcified stone measuring 15 mm. No biliary dilatation appreciated. Pancreas: Pancreas is  normal Spleen: Spleen is normal Adrenals/Urinary Tract: Adrenal glands are normal. Kidneys are normal. No hydronephrosis. Bladder is normal. Stomach/Bowel: There is a periumbilical hernia with opening measuring about 5 cm. The contents of the hernia sac are not entirely visualized due to limitations regarding patient body habitus. The hernia sac contains transverse colon. There is mild inflammatory change where a fair into an E fair it loops cross the abdominal wall. There is mild wall thickening of the proximal loop. There is gas distal to the hernia, but it is also noted that the caliber of proximal large bowel is notably larger than that of distal large bowel. Proximal colon measures 7 cm, while colon distal to the hernia measures no more than 3 cm with descending and sigmoid colon measuring only about 1.5 cm. Appendix appears normal. Small bowel is normal. Vascular/Lymphatic: No vascular abnormalities. No significant adenopathy. Reproductive: Reproductive organs not visualized.  No pelvic masses. Other: No free fluid in the abdomen or pelvis. Musculoskeletal: No acute musculoskeletal abnormalities. IMPRESSION: Findings are concerning for an element of obstruction of the large bowel  related to anterior abdominal wall hernia involving transverse colon. There is also evidence to suggest strangulation. Critical Value/emergent results were called by telephone at the time of interpretation on 01/05/2016 at 4:35 pm to Dr. Hassan Rowan , who verbally acknowledged these results. Electronically Signed   By: Esperanza Heir M.D.   On: 01/05/2016 16:35     Visual Acuity Review  Right Eye Distance:   Left Eye Distance:   Bilateral Distance:    Right Eye Near:   Left Eye Near:    Bilateral Near:         MDM   1. Large bowel obstruction (HCC)   2. Abdominal pain in female patient      Explained to patient I think that she has a bowel obstruction. Recommend going to [facility] ED for further evaluation such  wants to be evaluated him see or to start workup here. She is elected to start workup here in the expected to her that that may actually be a longer wait then going to [facility] ED.  Will adminster Zofran 8 mg sublingual obtain CBC and other lab work and obtain CT abdomen pelvis with contrast.  Discussed with Dr. Cletis [place] surgeon on call and will send patient to the ED for evaluation and treatment. Also discussed with charge nurse Tammy Sours who is aware of patient's arrival by private vehicle..  Note: This dictation was prepared with Dragon dictation along with smaller phrase technology. Any transcriptional errors that result from this process are unintentional.    Hassan Rowan, MD 01/05/16 1731

## 2016-01-05 NOTE — Anesthesia Postprocedure Evaluation (Signed)
Anesthesia Post Note  Patient: Bridget Williams  Procedure(s) Performed: Procedure(s) (LRB): EXPLORATORY LAPAROTOMY (N/A) HERNIA REPAIR INGUINAL INCARCERATED  Patient location during evaluation: PACU Anesthesia Type: General Level of consciousness: awake Pain management: pain level controlled Vital Signs Assessment: post-procedure vital signs reviewed and stable Respiratory status: spontaneous breathing Cardiovascular status: blood pressure returned to baseline Anesthetic complications: no    Last Vitals:  Filed Vitals:   01/05/16 2000 01/05/16 2214  BP: 160/73 163/94  Pulse: 103 122  Temp:  36.3 C  Resp: 14 18    Last Pain:  Filed Vitals:   01/05/16 2220  PainSc: Asleep                 VAN STAVEREN,Achol Azpeitia

## 2016-01-05 NOTE — ED Notes (Signed)
Pt transported to OR

## 2016-01-06 LAB — BASIC METABOLIC PANEL
ANION GAP: 8 (ref 5–15)
BUN: 13 mg/dL (ref 6–20)
CHLORIDE: 104 mmol/L (ref 101–111)
CO2: 24 mmol/L (ref 22–32)
CREATININE: 0.75 mg/dL (ref 0.44–1.00)
Calcium: 8.9 mg/dL (ref 8.9–10.3)
GFR calc Af Amer: >60 mL/min (ref >60)
Glucose, Bld: 149 mg/dL — ABNORMAL HIGH (ref 65–99)
Potassium: 4.1 mmol/L (ref 3.5–5.1)
SODIUM: 136 mmol/L (ref 135–145)

## 2016-01-06 LAB — CBC
HCT: 36.4 % (ref 35.0–47.0)
HEMOGLOBIN: 11.7 g/dL — AB (ref 12.0–16.0)
MCH: 24.3 pg — AB (ref 26.0–34.0)
MCHC: 32.1 g/dL (ref 32.0–36.0)
MCV: 75.7 fL — ABNORMAL LOW (ref 80.0–100.0)
PLATELETS: 269 10*3/uL (ref 150–440)
RBC: 4.81 MIL/uL (ref 3.80–5.20)
RDW: 18.2 % — ABNORMAL HIGH (ref 11.5–14.5)
WBC: 20 10*3/uL — AB (ref 3.6–11.0)

## 2016-01-06 MED ORDER — KETOROLAC TROMETHAMINE 30 MG/ML IJ SOLN
30.0000 mg | Freq: Four times a day (QID) | INTRAMUSCULAR | Status: DC
  Administered 2016-01-06 – 2016-01-08 (×8): 30 mg via INTRAVENOUS
  Filled 2016-01-06 (×8): qty 1

## 2016-01-06 MED ORDER — CYCLOBENZAPRINE HCL 10 MG PO TABS
10.0000 mg | ORAL_TABLET | Freq: Three times a day (TID) | ORAL | Status: DC
  Administered 2016-01-06 – 2016-01-08 (×7): 10 mg via ORAL
  Filled 2016-01-06 (×8): qty 1

## 2016-01-06 NOTE — Progress Notes (Signed)
60 yr old POD#1 from incarcerated ventral hernia repair.  Patient sitting in chair, states pain well controlled.  She states no nausea but some indigestion and burping.  She denies passing any flatus and does feel slightly bloated still.   Filed Vitals:   01/05/16 2320 01/05/16 2356  BP: 149/78 148/72  Pulse: 119 117  Temp: 98.1 F (36.7 C) 98.2 F (36.8 C)  Resp: 16 16   PE:  Gen: NAD Abd: incision with some bloody drainage but no erythema, appropriately tender, moderately distened Ext: 2+ pulses, no edema  CBC Latest Ref Rng 01/06/2016 01/05/2016  WBC 3.6 - 11.0 K/uL 20.0(H) 11.8(H)  Hemoglobin 12.0 - 16.0 g/dL 11.7(L) 13.5  Hematocrit 35.0 - 47.0 % 36.4 42.2  Platelets 150 - 440 K/uL 269 269   CMP Latest Ref Rng 01/06/2016 01/05/2016  Glucose 65 - 99 mg/dL 409(W149(H) 119(J111(H)  BUN 6 - 20 mg/dL 13 12  Creatinine 4.780.44 - 1.00 mg/dL 2.950.75 6.210.85  Sodium 308135 - 145 mmol/L 136 137  Potassium 3.5 - 5.1 mmol/L 4.1 4.0  Chloride 101 - 111 mmol/L 104 104  CO2 22 - 32 mmol/L 24 26  Calcium 8.9 - 10.3 mg/dL 8.9 9.3  Total Protein 6.5 - 8.1 g/dL - 8.1  Total Bilirubin 0.3 - 1.2 mg/dL - 0.6  Alkaline Phos 38 - 126 U/L - 90  AST 15 - 41 U/L - 18  ALT 14 - 54 U/L - 19    A/P:  60 yr old POD#1 from incarcerated ventral hernia repair Pain: continue prn pain medication and flexeril Clear liquid diet today Ambulate in hallways today

## 2016-01-06 NOTE — Progress Notes (Signed)
RN Casimiro NeedleMichael taking over as primary nurse for pt; report received at bedside from [employer]

## 2016-01-07 LAB — CBC
HEMATOCRIT: 34.1 % — AB (ref 35.0–47.0)
HEMOGLOBIN: 10.8 g/dL — AB (ref 12.0–16.0)
MCH: 24.1 pg — ABNORMAL LOW (ref 26.0–34.0)
MCHC: 31.8 g/dL — AB (ref 32.0–36.0)
MCV: 75.7 fL — ABNORMAL LOW (ref 80.0–100.0)
Platelets: 231 10*3/uL (ref 150–440)
RBC: 4.5 MIL/uL (ref 3.80–5.20)
RDW: 18.4 % — ABNORMAL HIGH (ref 11.5–14.5)
WBC: 10.2 10*3/uL (ref 3.6–11.0)

## 2016-01-07 LAB — BASIC METABOLIC PANEL
Anion gap: 8 (ref 5–15)
BUN: 11 mg/dL (ref 6–20)
CHLORIDE: 106 mmol/L (ref 101–111)
CO2: 26 mmol/L (ref 22–32)
CREATININE: 0.95 mg/dL (ref 0.44–1.00)
Calcium: 8.4 mg/dL — ABNORMAL LOW (ref 8.9–10.3)
GFR calc non Af Amer: >60 mL/min (ref >60)
Glucose, Bld: 118 mg/dL — ABNORMAL HIGH (ref 65–99)
POTASSIUM: 3.9 mmol/L (ref 3.5–5.1)
SODIUM: 140 mmol/L (ref 135–145)

## 2016-01-07 NOTE — Progress Notes (Signed)
60 yr old POD#2 from incarcerated ventral hernia repair.  Patient sitting in chair, states pain well controlled.  She states that she tolerated liquids well and would like to try solid foods.   She is passing flatus and feels less bloated.   Filed Vitals:   01/07/16 0624 01/07/16 1500  BP:  138/70  Pulse: 120 110  Temp:  98.4 F (36.9 C)  Resp:  19   I/O last 3 completed shifts: In: 8273 [P.O.:1710; I.V.:6563] Out: 2350 [Urine:2150; Blood:200] Total I/O In: 819 [P.O.:360; I.V.:459] Out: 1550 [Urine:1550]   PE:  Gen: NAD Abd: incision with some bloody drainage but no erythema, appropriately tender, distension improved Ext: 2+ pulses, no edema  CBC Latest Ref Rng 01/07/2016 01/06/2016 01/05/2016  WBC 3.6 - 11.0 K/uL 10.2 20.0(H) 11.8(H)  Hemoglobin 12.0 - 16.0 g/dL 10.8(L) 11.7(L) 13.5  Hematocrit 35.0 - 47.0 % 34.1(L) 36.4 42.2  Platelets 150 - 440 K/uL 231 269 269   CMP Latest Ref Rng 01/07/2016 01/06/2016 01/05/2016  Glucose 65 - 99 mg/dL 191(Y118(H) 782(N149(H) 562(Z111(H)  BUN 6 - 20 mg/dL 11 13 12   Creatinine 0.44 - 1.00 mg/dL 3.080.95 6.570.75 8.460.85  Sodium 135 - 145 mmol/L 140 136 137  Potassium 3.5 - 5.1 mmol/L 3.9 4.1 4.0  Chloride 101 - 111 mmol/L 106 104 104  CO2 22 - 32 mmol/L 26 24 26   Calcium 8.9 - 10.3 mg/dL 9.6(E8.4(L) 8.9 9.3  Total Protein 6.5 - 8.1 g/dL - - 8.1  Total Bilirubin 0.3 - 1.2 mg/dL - - 0.6  Alkaline Phos 38 - 126 U/L - - 90  AST 15 - 41 U/L - - 18  ALT 14 - 54 U/L - - 19    A/P:  60 yr old POD#2 from incarcerated ventral hernia repair Pain: continue prn pain medication and flexeril Advance to regular diet Ambulate in hallways today

## 2016-01-08 ENCOUNTER — Encounter: Payer: Self-pay | Admitting: Surgery

## 2016-01-08 MED ORDER — OXYCODONE HCL 5 MG PO TABS: 5.0000 mg | ORAL_TABLET | ORAL | Status: DC | PRN

## 2016-01-08 MED ORDER — CYCLOBENZAPRINE HCL 10 MG PO TABS: 10.0000 mg | ORAL_TABLET | Freq: Three times a day (TID) | ORAL | Status: DC

## 2016-01-08 NOTE — Discharge Instructions (Signed)
Ventral Hernia Repair, Care After Refer to this sheet in the next few weeks. These instructions provide you with information on caring for yourself after your procedure. Your caregiver may also give you more specific instructions. Your treatment has been planned according to current medical practices, but problems sometimes occur. Call your caregiver if you have any problems or questions after your procedure.  HOME CARE INSTRUCTIONS   Only take over-the-counter or prescription medicines as directed by your caregiver. If antibiotic medicines are prescribed, take them as directed. Finish them even if you start to feel better.  Always wash your hands before touching your abdomen.  Take your bandages (dressings) off after 48 hours or as directed by your caregiver. You may have skin adhesive strips or skin glue over the surgical cuts (incisions). Do not take the strips off or peel the glue off. These will fall off on their own. Do not remove staples. Keep area covered with bandages until all drainage stops.  Okay to shower. Do not take tub baths or go swimming until your caregiver approves.  Check your incision area every day for swelling, redness, warmth, and blood or fluid leaking from the incision. These are signs of infection. Wash your hands before you check.  Hold a pillow over your abdomen when you cough or sneeze to help ease pain.  Eat foods that are high in fiber, such as whole grains, fruits, and vegetables. Fiber helps prevent difficult bowel movements (constipation).  Drink enough fluids to keep your urine clear or pale yellow.  Rest and lessen activity for 4-5 days after the surgery. You may take short walks if your caregiver approves. Do not drive until approved by your caregiver.  Do not lift anything heavy, participate in sports, or have sexual intercourse for 6-8 weeks or until your caregiver approves.   Ask your caregiver when you can return to work.  Keep all follow-up  appointments. SEEK MEDICAL CARE IF:   You have pain even after taking pain medicine.  You have not had a bowel movement in 3 days.  You have cramps or are nauseous. SEEK IMMEDIATE MEDICAL CARE IF:   You have pain or swelling that is getting worse.  You have redness around your incisions.  Your incision is pulling apart.  You have blood or fluid leaking from your incisions.  You are vomiting.  You cannot pass urine. MAKE SURE YOU:   Understand these instructions.  Will watch your condition.  Will get help right away if you are not doing well or get worse.   This information is not intended to replace advice given to you by your health care provider. Make sure you discuss any questions you have with your health care provider.   Document Released: 09/30/2012 Document Revised: 11/04/2014 Document Reviewed: 09/30/2012 Elsevier Interactive Patient Education [employer]2016 Elsevier Inc.

## 2016-01-08 NOTE — Discharge Summary (Signed)
Patient ID: Bridget Williams MRN: [id number] DOB/AGE: [birth year] 60 y.o.  Admit date: 01/05/2016 Discharge date: 01/08/2016  Discharge Diagnoses:  Ventral hernia  Procedures Performed: Ventral hernia repair  Discharged Condition: good  Hospital Course: Patient taken the operating room emergently for an incarcerated ventral hernia. Underwent an open repair without difficulty. Was able to ambulate, tolerating diet, pain controlled on oral medications prior to discharge.  Discharge Orders: Discharge home.   Disposition: 02-Short Term Hospital  Discharge Medications:    Medication List    TAKE these medications        acetaminophen 500 MG tablet  Commonly known as:  TYLENOL  Take 1,000 mg by mouth every 6 (six) hours as needed for mild pain or headache.     aspirin EC 81 MG tablet  Take 81 mg by mouth daily.     buPROPion 150 MG 12 hr tablet  Commonly known as:  WELLBUTRIN SR  Take 150 mg by mouth 2 (two) times daily.     cyclobenzaprine 10 MG tablet  Commonly known as:  FLEXERIL  Take 1 tablet (10 mg total) by mouth 3 (three) times daily.     levothyroxine 100 MCG tablet  Commonly known as:  SYNTHROID, LEVOTHROID  Take 100 mcg by mouth daily before breakfast.     multivitamin with minerals Tabs tablet  Take 1 tablet by mouth daily.     oxyCODONE 5 MG immediate release tablet  Commonly known as:  Oxy IR/ROXICODONE  Take 1 tablet (5 mg total) by mouth every 4 (four) hours as needed for moderate pain.     pantoprazole 40 MG tablet  Commonly known as:  PROTONIX  Take 40 mg by mouth daily.     Vitamin D (Ergocalciferol) 50000 units Caps capsule  Commonly known as:  DRISDOL  Take 50,000 Units by mouth every 30 (thirty) days. Pt takes on the 1st of every month.        Follwup: Follow-up Information    [facility] with [facility]. Schedule an appointment as soon as possible for a visit in 1 week.   Why:  postop   Contact information:   [address] [place] [id number] [phone]      Signed: Ricarda FrameCharles Clell Trahan 01/08/2016, 10:55 AM

## 2016-01-08 NOTE — Progress Notes (Signed)
Alert and orientd. Vss. No signs of acute distress. Discharge instructions given. Patient verbalizes understanding.no other issues observed.

## 2016-01-08 NOTE — Final Progress Note (Signed)
3 Days Post-Op   Subjective:  Patient reports feeling better today than yesterday. Pain controlled oral medications. Tolerating a diet and passing flatus.  Vital signs in last 24 hours: Temp:  [98.2 F (36.8 C)-98.4 F (36.9 C)] 98.3 F (36.8 C) (03/13 0505) Pulse Rate:  [110-124] 124 (03/13 0505) Resp:  [19-20] 20 (03/13 0505) BP: (138-156)/(70-81) 156/74 mmHg (03/13 0505) SpO2:  [92 %-96 %] 92 % (03/13 0505) Last BM Date: 01/02/16  Intake/Output from previous day: 03/12 0701 - 03/13 0700 In: 2160 [P.O.:960; I.V.:1200] Out: 2825 [Urine:2825]  GI: Abdomen is soft, nondistended, appropriately tender to palpation at the incision site. Staples in place without evidence of infection or drainage.  Lab Results:  CBC  Recent Labs  01/06/16 0604 01/07/16 0843  WBC 20.0* 10.2  HGB 11.7* 10.8*  HCT 36.4 34.1*  PLT 269 231   CMP     Component Value Date/Time   NA 140 01/07/2016 0843   K 3.9 01/07/2016 0843   CL 106 01/07/2016 0843   CO2 26 01/07/2016 0843   GLUCOSE 118* 01/07/2016 0843   BUN 11 01/07/2016 0843   CREATININE 0.95 01/07/2016 0843   CALCIUM 8.4* 01/07/2016 0843   PROT 8.1 01/05/2016 1300   ALBUMIN 4.2 01/05/2016 1300   AST 18 01/05/2016 1300   ALT 19 01/05/2016 1300   ALKPHOS 90 01/05/2016 1300   BILITOT 0.6 01/05/2016 1300   GFRNONAA >60 01/07/2016 0843   GFRAA >60 01/07/2016 0843   PT/INR No results for input(s): LABPROT, INR in the last 72 hours.  Studies/Results: No results found.  Assessment/Plan: 60 year old female postop day #3 from an incarcerated ventral hernia repair. Doing better today. Patient desires to go home. We'll discharge home with follow-up in clinic next week.   Ricarda Frameharles Richad Ramsay, MD FACS General Surgeon  01/08/2016

## 2016-01-09 LAB — SURGICAL PATHOLOGY

## 2016-01-13 ENCOUNTER — Telehealth: Payer: Self-pay

## 2016-01-13 NOTE — Telephone Encounter (Signed)
Patient's disability paperwork has been filled out and faxed with additional information that was requested.

## 2016-01-16 ENCOUNTER — Other Ambulatory Visit: Payer: Self-pay

## 2016-01-17 ENCOUNTER — Telehealth: Payer: Self-pay

## 2016-01-17 ENCOUNTER — Ambulatory Visit (INDEPENDENT_AMBULATORY_CARE_PROVIDER_SITE_OTHER): Payer: Managed Care, Other (non HMO) | Admitting: Surgery

## 2016-01-17 ENCOUNTER — Encounter: Payer: Self-pay | Admitting: Surgery

## 2016-01-17 VITALS — Temp 98.1°F | Ht 60.0 in | Wt 219.0 lb

## 2016-01-17 DIAGNOSIS — Z09 Encounter for follow-up examination after completed treatment for conditions other than malignant neoplasm: Secondary | ICD-10-CM

## 2016-01-17 NOTE — Progress Notes (Signed)
S/p lap and ventral hernia repair for  Incarceration.  Doing well, tolerating PO, having bm, only taking tylenol for pain  PE: NAD Abd: staples in place, incision c/d/i, staples removed, no infection or recurrence  A/P doing well discussed with her about the good chance of recurrence given that the pain was primarily done. Currently no acute surgical issues we will give her an appointment on a when necessary basis

## 2016-01-17 NOTE — Telephone Encounter (Signed)
FMLA Form from [employer] was filled out with new information and faxed. Patient was notified.

## 2016-01-18 ENCOUNTER — Telehealth: Payer: Self-pay | Admitting: Surgery

## 2016-01-18 NOTE — Telephone Encounter (Signed)
Patient requested a letter to be faxed to her employer with her restrictions (please see Letter). Faxed at [phone] Attention: Dr. Laure Kidneyandy Young.

## 2016-01-18 NOTE — Telephone Encounter (Signed)
Patient has called and stated that her work is requesting a note to return to work on 01/22/16 only restriction is to not lift anything over 15 lbs. Please fax this to attention to: Dr. Laure Kidneyandy Young @ [phone]. Please call patient when this is completed.

## 2016-01-24 ENCOUNTER — Telehealth: Payer: Self-pay | Admitting: Surgery

## 2016-01-24 NOTE — Telephone Encounter (Signed)
Patient reports that yesterday, patient began noticing that the bottom of midline incision was "trying to open" approximately 1 inch at bottom of incision. She placed steri-strips over this area and 4x4 to contain some bleeding and clear drainage that she is having. I explained to the patient that she has already taken the proper steps to handling this problem as this is what we would do in clinic. However, I would like her to follow-up in clinic next week. She had agreed to this. Patient placed on schedule 12/31/15 with Dr. Orvis BrillLoflin. I did explain however, if she develops fever, chills, redness at incision site, or purulent drainage she will need to call our office and speak with a nurse immediately. She verbalized understanding of this.

## 2016-01-24 NOTE — Telephone Encounter (Signed)
Patient had EXPLORATORY LAPAROTOMY and HERNIA REPAIR INGUINAL INCARCERATED on 03/10 with Dr Excell Seltzerooper. Her staples were removed at post op visit on 03/22. She is calling today because her skin is separating where her staples once were and she is also having some bleeding. Please call and advise.

## 2016-01-31 ENCOUNTER — Encounter: Payer: Self-pay | Admitting: Surgery

## 2016-01-31 ENCOUNTER — Ambulatory Visit (INDEPENDENT_AMBULATORY_CARE_PROVIDER_SITE_OTHER): Payer: Managed Care, Other (non HMO) | Admitting: Surgery

## 2016-01-31 VITALS — BP 140/84 | Temp 98.0°F | Wt 221.0 lb

## 2016-01-31 DIAGNOSIS — K430 Incisional hernia with obstruction, without gangrene: Secondary | ICD-10-CM

## 2016-01-31 NOTE — Patient Instructions (Signed)
Please place a piece of gauze on your naval area to keep it dry. Also apply a piece of gauze on your low abdomen incision.  You are able to use nail polish removal on the areas that have tape residue.

## 2016-01-31 NOTE — Progress Notes (Signed)
60 year old female who had an incarcerated ventral hernia repair on 3/10 by Dr. Excell Seltzerooper.  Patient states that she had some increase drainage from the area in the inferior portion as well as around the umbilicus. Patient denies having any fever or chills. She has been putting dry dressings to this area. Patient states that she is eating better and having bowel movements. Patient states that the pain tolerable and she is only occasionally taken some pain medication.  Filed Vitals:   01/31/16 1505  BP: 140/84  Temp: 98 F (36.7 C)   PE:  Gen: NAD Abd: soft, obese, incision around umblicus 1cm opening with fibrinous material, maceration of skin along the umbilicus, no erythema,  No purulence, inferior 3cm small opening but good granulation tissue  A/P:  Patient doing well after incisional hernia repair. Patient has had some opening of the incisions all appear to be noninfected there was some minimal serous drainage. Instructed to put dry dressings over the area while for follow-up next week with Dr. Excell Seltzerooper.

## 2016-02-06 ENCOUNTER — Ambulatory Visit (INDEPENDENT_AMBULATORY_CARE_PROVIDER_SITE_OTHER): Payer: Managed Care, Other (non HMO) | Admitting: Surgery

## 2016-02-06 ENCOUNTER — Encounter: Payer: Self-pay | Admitting: Surgery

## 2016-02-06 VITALS — Temp 98.7°F | Ht 60.0 in | Wt 222.0 lb

## 2016-02-06 DIAGNOSIS — K430 Incisional hernia with obstruction, without gangrene: Secondary | ICD-10-CM

## 2016-02-06 DIAGNOSIS — K436 Other and unspecified ventral hernia with obstruction, without gangrene: Secondary | ICD-10-CM

## 2016-02-06 NOTE — Patient Instructions (Addendum)

## 2016-02-06 NOTE — Progress Notes (Signed)
Outpatient postop visit  02/06/2016  Mare LoanMelinda Ray Schor is an 60 y.o. female.    Procedure:  Repair of Incarcerated ventral hernia  CC: None  HPI: This patient status post repair of incarcerated ventral hernia with bowel obstruction. She was repaired primarily due to the presence of a large amount of transverse colon with relative transient ischemia. Pacer feels well at this point has no pain did state that she had some minor drainage from the lower part of her incision.  Medications reviewed.    Physical Exam:  There were no vitals taken for this visit.    PE: Morbidly obese No erythema no drainage minimal wound separation at the inferior and somewhat at the cephalad area measuring 1 x 3 mm which is treated with silver nitrate infection   Assessment/Plan:  Discussed with the patient and her possible 50% risk of recurrence and the signs of recurrence and asked her to call should she noticed anything abnormal. Patient is counseled to do no heavy lifting for another 2 weeks and to follow up as needed and to avoid signs of incarceration and not wait that long should this recur.  Lattie Hawichard E Imari Sivertsen, MD, FACS

## 2016-05-01 ENCOUNTER — Other Ambulatory Visit: Payer: Self-pay | Admitting: Internal Medicine

## 2016-05-01 DIAGNOSIS — E041 Nontoxic single thyroid nodule: Secondary | ICD-10-CM

## 2016-05-06 ENCOUNTER — Ambulatory Visit
Admission: RE | Admit: 2016-05-06 | Discharge: 2016-05-06 | Disposition: A | Discharge status: Home / Self Care | Payer: Managed Care, Other (non HMO) | Source: Ambulatory Visit | Attending: Internal Medicine | Admitting: Internal Medicine

## 2016-05-06 DIAGNOSIS — E041 Nontoxic single thyroid nodule: Secondary | ICD-10-CM | POA: Insufficient documentation

## 2016-12-10 ENCOUNTER — Other Ambulatory Visit: Payer: Self-pay | Admitting: Internal Medicine

## 2016-12-10 DIAGNOSIS — Z1231 Encounter for screening mammogram for malignant neoplasm of breast: Secondary | ICD-10-CM

## 2016-12-19 ENCOUNTER — Ambulatory Visit
Admission: RE | Admit: 2016-12-19 | Discharge: 2016-12-19 | Disposition: A | Discharge status: Home / Self Care | Payer: Managed Care, Other (non HMO) | Source: Ambulatory Visit | Attending: Internal Medicine | Admitting: Internal Medicine

## 2016-12-19 DIAGNOSIS — Z1231 Encounter for screening mammogram for malignant neoplasm of breast: Secondary | ICD-10-CM

## 2017-12-16 ENCOUNTER — Other Ambulatory Visit: Payer: Self-pay | Admitting: Internal Medicine

## 2017-12-16 DIAGNOSIS — Z1231 Encounter for screening mammogram for malignant neoplasm of breast: Secondary | ICD-10-CM

## 2017-12-22 ENCOUNTER — Ambulatory Visit
Admission: RE | Admit: 2017-12-22 | Discharge: 2017-12-22 | Disposition: A | Discharge status: Home / Self Care | Payer: Managed Care, Other (non HMO) | Source: Ambulatory Visit | Attending: Internal Medicine | Admitting: Internal Medicine

## 2017-12-22 DIAGNOSIS — Z1231 Encounter for screening mammogram for malignant neoplasm of breast: Secondary | ICD-10-CM | POA: Diagnosis not present

## 2019-01-22 ENCOUNTER — Other Ambulatory Visit: Payer: Self-pay | Admitting: Internal Medicine

## 2019-01-22 DIAGNOSIS — Z1231 Encounter for screening mammogram for malignant neoplasm of breast: Secondary | ICD-10-CM

## 2019-01-22 DIAGNOSIS — Z803 Family history of malignant neoplasm of breast: Secondary | ICD-10-CM

## 2019-05-03 ENCOUNTER — Ambulatory Visit
Admission: RE | Admit: 2019-05-03 | Discharge: 2019-05-03 | Disposition: A | Discharge status: Home / Self Care | Payer: Managed Care, Other (non HMO) | Source: Ambulatory Visit | Attending: Internal Medicine | Admitting: Internal Medicine

## 2019-05-03 ENCOUNTER — Other Ambulatory Visit: Payer: Self-pay

## 2019-05-03 DIAGNOSIS — Z1231 Encounter for screening mammogram for malignant neoplasm of breast: Secondary | ICD-10-CM | POA: Diagnosis present

## 2019-05-03 DIAGNOSIS — Z803 Family history of malignant neoplasm of breast: Secondary | ICD-10-CM | POA: Insufficient documentation

## 2020-01-24 ENCOUNTER — Other Ambulatory Visit: Payer: Self-pay | Admitting: Internal Medicine

## 2020-01-24 DIAGNOSIS — Z1231 Encounter for screening mammogram for malignant neoplasm of breast: Secondary | ICD-10-CM

## 2020-05-24 ENCOUNTER — Ambulatory Visit
Admission: RE | Admit: 2020-05-24 | Discharge: 2020-05-24 | Disposition: A | Discharge status: Home / Self Care | Payer: Managed Care, Other (non HMO) | Source: Ambulatory Visit | Attending: Internal Medicine | Admitting: Internal Medicine

## 2020-05-24 DIAGNOSIS — Z1231 Encounter for screening mammogram for malignant neoplasm of breast: Secondary | ICD-10-CM | POA: Insufficient documentation

## 2021-02-06 ENCOUNTER — Other Ambulatory Visit: Payer: Self-pay | Admitting: Internal Medicine

## 2021-02-06 DIAGNOSIS — Z1231 Encounter for screening mammogram for malignant neoplasm of breast: Secondary | ICD-10-CM

## 2022-03-12 ENCOUNTER — Other Ambulatory Visit: Payer: Self-pay | Admitting: Nurse Practitioner

## 2022-03-12 DIAGNOSIS — Z1231 Encounter for screening mammogram for malignant neoplasm of breast: Secondary | ICD-10-CM

## 2022-03-20 ENCOUNTER — Ambulatory Visit
Admission: RE | Admit: 2022-03-20 | Discharge: 2022-03-20 | Disposition: A | Discharge status: Home / Self Care | Payer: 59 | Source: Ambulatory Visit | Attending: Nurse Practitioner | Admitting: Nurse Practitioner

## 2022-03-20 DIAGNOSIS — Z1231 Encounter for screening mammogram for malignant neoplasm of breast: Secondary | ICD-10-CM | POA: Insufficient documentation

## 2022-05-20 ENCOUNTER — Inpatient Hospital Stay: Payer: 59 | Admitting: Anesthesiology

## 2022-05-20 ENCOUNTER — Encounter: Payer: Self-pay | Admitting: Family Medicine

## 2022-05-20 ENCOUNTER — Emergency Department: Payer: 59

## 2022-05-20 ENCOUNTER — Encounter
Admission: EM | Disposition: A | Discharge status: Home / Self Care | Payer: Self-pay | Source: Home / Self Care | Attending: Internal Medicine

## 2022-05-20 ENCOUNTER — Inpatient Hospital Stay
Admission: EM | Admit: 2022-05-20 | Discharge: 2022-05-24 | DRG: 854 | Disposition: A | Discharge status: Home / Self Care | Payer: 59 | Attending: Internal Medicine | Admitting: Internal Medicine

## 2022-05-20 ENCOUNTER — Other Ambulatory Visit: Payer: Self-pay

## 2022-05-20 DIAGNOSIS — I10 Essential (primary) hypertension: Secondary | ICD-10-CM | POA: Diagnosis present

## 2022-05-20 DIAGNOSIS — Z79899 Other long term (current) drug therapy: Secondary | ICD-10-CM

## 2022-05-20 DIAGNOSIS — Z79890 Hormone replacement therapy (postmenopausal): Secondary | ICD-10-CM

## 2022-05-20 DIAGNOSIS — Z7982 Long term (current) use of aspirin: Secondary | ICD-10-CM | POA: Diagnosis not present

## 2022-05-20 DIAGNOSIS — Z6841 Body Mass Index (BMI) 40.0 and over, adult: Secondary | ICD-10-CM | POA: Diagnosis not present

## 2022-05-20 DIAGNOSIS — A4150 Gram-negative sepsis, unspecified: Secondary | ICD-10-CM | POA: Diagnosis not present

## 2022-05-20 DIAGNOSIS — K660 Peritoneal adhesions (postprocedural) (postinfection): Secondary | ICD-10-CM | POA: Diagnosis present

## 2022-05-20 DIAGNOSIS — F32A Depression, unspecified: Secondary | ICD-10-CM | POA: Diagnosis present

## 2022-05-20 DIAGNOSIS — R109 Unspecified abdominal pain: Secondary | ICD-10-CM | POA: Diagnosis present

## 2022-05-20 DIAGNOSIS — K219 Gastro-esophageal reflux disease without esophagitis: Secondary | ICD-10-CM

## 2022-05-20 DIAGNOSIS — K436 Other and unspecified ventral hernia with obstruction, without gangrene: Secondary | ICD-10-CM

## 2022-05-20 DIAGNOSIS — Z803 Family history of malignant neoplasm of breast: Secondary | ICD-10-CM | POA: Diagnosis not present

## 2022-05-20 DIAGNOSIS — E039 Hypothyroidism, unspecified: Secondary | ICD-10-CM | POA: Diagnosis present

## 2022-05-20 DIAGNOSIS — A419 Sepsis, unspecified organism: Principal | ICD-10-CM

## 2022-05-20 DIAGNOSIS — N390 Urinary tract infection, site not specified: Secondary | ICD-10-CM

## 2022-05-20 DIAGNOSIS — K430 Incisional hernia with obstruction, without gangrene: Secondary | ICD-10-CM | POA: Diagnosis present

## 2022-05-20 HISTORY — PX: VENTRAL HERNIA REPAIR: SHX424

## 2022-05-20 LAB — COMPREHENSIVE METABOLIC PANEL
ALT: 24 U/L (ref 0–44)
AST: 24 U/L (ref 15–41)
Albumin: 3.5 g/dL (ref 3.5–5.0)
Alkaline Phosphatase: 104 U/L (ref 38–126)
Anion gap: 11 (ref 5–15)
BUN: 12 mg/dL (ref 8–23)
CO2: 29 mmol/L (ref 22–32)
Calcium: 9.6 mg/dL (ref 8.9–10.3)
Chloride: 103 mmol/L (ref 98–111)
Creatinine, Ser: 1.1 mg/dL — ABNORMAL HIGH (ref 0.44–1.00)
GFR, Estimated: 56 mL/min — ABNORMAL LOW (ref >60)
Glucose, Bld: 111 mg/dL — ABNORMAL HIGH (ref 70–99)
Potassium: 4.3 mmol/L (ref 3.5–5.1)
Sodium: 143 mmol/L (ref 135–145)
Total Bilirubin: 1.1 mg/dL (ref 0.3–1.2)
Total Protein: 7.8 g/dL (ref 6.5–8.1)

## 2022-05-20 LAB — CBC WITH DIFFERENTIAL/PLATELET
Abs Immature Granulocytes: 0.42 10*3/uL — ABNORMAL HIGH (ref 0.00–0.07)
Basophils Absolute: 0.1 10*3/uL (ref 0.0–0.1)
Basophils Relative: 0 %
Eosinophils Absolute: 0.3 10*3/uL (ref 0.0–0.5)
Eosinophils Relative: 1 %
HCT: 42.2 % (ref 36.0–46.0)
Hemoglobin: 12.9 g/dL (ref 12.0–15.0)
Immature Granulocytes: 2 %
Lymphocytes Relative: 9 %
Lymphs Abs: 1.9 10*3/uL (ref 0.7–4.0)
MCH: 24.9 pg — ABNORMAL LOW (ref 26.0–34.0)
MCHC: 30.6 g/dL (ref 30.0–36.0)
MCV: 81.3 fL (ref 80.0–100.0)
Monocytes Absolute: 0.7 10*3/uL (ref 0.1–1.0)
Monocytes Relative: 4 %
Neutro Abs: 17.5 10*3/uL — ABNORMAL HIGH (ref 1.7–7.7)
Neutrophils Relative %: 84 %
Platelets: 519 10*3/uL — ABNORMAL HIGH (ref 150–400)
RBC: 5.19 MIL/uL — ABNORMAL HIGH (ref 3.87–5.11)
RDW: 16.9 % — ABNORMAL HIGH (ref 11.5–15.5)
WBC: 21 10*3/uL — ABNORMAL HIGH (ref 4.0–10.5)
nRBC: 0 % (ref 0.0–0.2)

## 2022-05-20 LAB — URINALYSIS, ROUTINE W REFLEX MICROSCOPIC
Bilirubin Urine: NEGATIVE
Glucose, UA: NEGATIVE mg/dL
Hgb urine dipstick: NEGATIVE
Ketones, ur: 20 mg/dL — AB
Nitrite: POSITIVE — AB
Protein, ur: 30 mg/dL — AB
Specific Gravity, Urine: 1.017 (ref 1.005–1.030)
WBC, UA: >50 WBC/hpf — ABNORMAL HIGH (ref 0–5)
pH: 5 (ref 5.0–8.0)

## 2022-05-20 LAB — LIPASE, BLOOD: Lipase: 20 U/L (ref 11–51)

## 2022-05-20 SURGERY — REPAIR, HERNIA, VENTRAL
Anesthesia: General | Site: Abdomen

## 2022-05-20 MED ORDER — ACETAMINOPHEN 325 MG PO TABS
650.0000 mg | ORAL_TABLET | Freq: Four times a day (QID) | ORAL | Status: DC | PRN
  Administered 2022-05-23: 650 mg via ORAL
  Filled 2022-05-20: qty 2

## 2022-05-20 MED ORDER — BUPROPION HCL ER (SR) 150 MG PO TB12
150.0000 mg | ORAL_TABLET | Freq: Two times a day (BID) | ORAL | Status: DC
  Administered 2022-05-21 – 2022-05-23 (×5): 150 mg via ORAL
  Filled 2022-05-20 (×5): qty 1

## 2022-05-20 MED ORDER — TRAZODONE HCL 50 MG PO TABS
25.0000 mg | ORAL_TABLET | Freq: Every evening | ORAL | Status: DC | PRN
  Administered 2022-05-22: 25 mg via ORAL
  Filled 2022-05-20: qty 1

## 2022-05-20 MED ORDER — PHENYLEPHRINE 80 MCG/ML (10ML) SYRINGE FOR IV PUSH (FOR BLOOD PRESSURE SUPPORT)
PREFILLED_SYRINGE | INTRAVENOUS | Status: AC
  Filled 2022-05-20: qty 10

## 2022-05-20 MED ORDER — SODIUM CHLORIDE 0.9 % IV BOLUS
1000.0000 mL | Freq: Once | INTRAVENOUS | Status: AC
  Administered 2022-05-20: 1000 mL via INTRAVENOUS

## 2022-05-20 MED ORDER — ONDANSETRON HCL 4 MG PO TABS
4.0000 mg | ORAL_TABLET | Freq: Four times a day (QID) | ORAL | Status: DC | PRN
  Administered 2022-05-23: 4 mg via ORAL
  Filled 2022-05-20: qty 1

## 2022-05-20 MED ORDER — SODIUM CHLORIDE 0.9 % IV SOLN: INTRAVENOUS | Status: DC | PRN

## 2022-05-20 MED ORDER — SUCCINYLCHOLINE CHLORIDE 200 MG/10ML IV SOSY
PREFILLED_SYRINGE | INTRAVENOUS | Status: DC | PRN
  Administered 2022-05-20: 100 mg via INTRAVENOUS

## 2022-05-20 MED ORDER — FENTANYL CITRATE (PF) 100 MCG/2ML IJ SOLN
INTRAMUSCULAR | Status: DC | PRN
  Administered 2022-05-20: 50 ug via INTRAVENOUS

## 2022-05-20 MED ORDER — SODIUM CHLORIDE 0.9 % IV SOLN
2.0000 g | INTRAVENOUS | Status: DC
  Filled 2022-05-20 (×2): qty 20

## 2022-05-20 MED ORDER — ACETAMINOPHEN 10 MG/ML IV SOLN
INTRAVENOUS | Status: AC
  Filled 2022-05-20: qty 100

## 2022-05-20 MED ORDER — PROPOFOL 10 MG/ML IV BOLUS
INTRAVENOUS | Status: AC
  Filled 2022-05-20: qty 40

## 2022-05-20 MED ORDER — MIDAZOLAM HCL 2 MG/2ML IJ SOLN
INTRAMUSCULAR | Status: AC
  Filled 2022-05-20: qty 2

## 2022-05-20 MED ORDER — ACETAMINOPHEN 650 MG RE SUPP: 650.0000 mg | Freq: Four times a day (QID) | RECTAL | Status: DC | PRN

## 2022-05-20 MED ORDER — MIDAZOLAM HCL 2 MG/2ML IJ SOLN
INTRAMUSCULAR | Status: DC | PRN
  Administered 2022-05-20: 2 mg via INTRAVENOUS

## 2022-05-20 MED ORDER — ONDANSETRON HCL 4 MG/2ML IJ SOLN
4.0000 mg | Freq: Four times a day (QID) | INTRAMUSCULAR | Status: DC | PRN
  Administered 2022-05-22: 4 mg via INTRAVENOUS
  Filled 2022-05-20: qty 2

## 2022-05-20 MED ORDER — SUGAMMADEX SODIUM 200 MG/2ML IV SOLN
INTRAVENOUS | Status: DC | PRN
  Administered 2022-05-20: 200 mg via INTRAVENOUS

## 2022-05-20 MED ORDER — DEXMEDETOMIDINE (PRECEDEX) IN NS 20 MCG/5ML (4 MCG/ML) IV SYRINGE
PREFILLED_SYRINGE | INTRAVENOUS | Status: DC | PRN
  Administered 2022-05-20 (×3): 4 ug via INTRAVENOUS

## 2022-05-20 MED ORDER — MORPHINE SULFATE (PF) 4 MG/ML IV SOLN
4.0000 mg | INTRAVENOUS | Status: DC | PRN
  Administered 2022-05-20 – 2022-05-21 (×3): 4 mg via INTRAVENOUS
  Filled 2022-05-20 (×2): qty 1

## 2022-05-20 MED ORDER — ASPIRIN 81 MG PO TBEC
81.0000 mg | DELAYED_RELEASE_TABLET | Freq: Every day | ORAL | Status: DC
  Administered 2022-05-21 – 2022-05-24 (×4): 81 mg via ORAL
  Filled 2022-05-20 (×4): qty 1

## 2022-05-20 MED ORDER — FENTANYL CITRATE (PF) 100 MCG/2ML IJ SOLN
INTRAMUSCULAR | Status: AC
  Filled 2022-05-20: qty 2

## 2022-05-20 MED ORDER — SODIUM CHLORIDE 0.9 % IV SOLN
1.0000 g | Freq: Once | INTRAVENOUS | Status: AC
  Administered 2022-05-20: 1 g via INTRAVENOUS
  Filled 2022-05-20: qty 10

## 2022-05-20 MED ORDER — PHENYLEPHRINE 80 MCG/ML (10ML) SYRINGE FOR IV PUSH (FOR BLOOD PRESSURE SUPPORT)
PREFILLED_SYRINGE | INTRAVENOUS | Status: DC | PRN
  Administered 2022-05-20: 80 ug via INTRAVENOUS

## 2022-05-20 MED ORDER — BUPIVACAINE-EPINEPHRINE 0.5% -1:200000 IJ SOLN
INTRAMUSCULAR | Status: DC | PRN
  Administered 2022-05-20: 30 mL

## 2022-05-20 MED ORDER — DEXAMETHASONE SODIUM PHOSPHATE 10 MG/ML IJ SOLN
INTRAMUSCULAR | Status: DC | PRN
  Administered 2022-05-20: 10 mg via INTRAVENOUS

## 2022-05-20 MED ORDER — HYDROCODONE-ACETAMINOPHEN 7.5-325 MG PO TABS
1.0000 | ORAL_TABLET | Freq: Four times a day (QID) | ORAL | Status: AC | PRN
  Administered 2022-05-21 (×3): 1 via ORAL
  Filled 2022-05-20 (×3): qty 1

## 2022-05-20 MED ORDER — ONDANSETRON HCL 4 MG/2ML IJ SOLN
INTRAMUSCULAR | Status: DC | PRN
  Administered 2022-05-20: 4 mg via INTRAVENOUS

## 2022-05-20 MED ORDER — ENOXAPARIN SODIUM 60 MG/0.6ML IJ SOSY
0.5000 mg/kg | PREFILLED_SYRINGE | INTRAMUSCULAR | Status: DC
  Administered 2022-05-21 – 2022-05-23 (×3): 47.5 mg via SUBCUTANEOUS
  Filled 2022-05-20 (×3): qty 0.6

## 2022-05-20 MED ORDER — ADULT MULTIVITAMIN W/MINERALS CH
1.0000 | ORAL_TABLET | Freq: Every day | ORAL | Status: DC
Start: 2022-05-21 — End: 2022-05-24
  Administered 2022-05-21 – 2022-05-24 (×4): 1 via ORAL
  Filled 2022-05-20 (×4): qty 1

## 2022-05-20 MED ORDER — BUPIVACAINE LIPOSOME 1.3 % IJ SUSP
INTRAMUSCULAR | Status: AC
  Filled 2022-05-20: qty 20

## 2022-05-20 MED ORDER — ONDANSETRON HCL 4 MG/2ML IJ SOLN
4.0000 mg | Freq: Once | INTRAMUSCULAR | Status: DC | PRN
Start: 2022-05-20 — End: 2022-05-20

## 2022-05-20 MED ORDER — MAGNESIUM HYDROXIDE 400 MG/5ML PO SUSP
30.0000 mL | Freq: Every day | ORAL | Status: DC | PRN
  Administered 2022-05-23: 30 mL via ORAL
  Filled 2022-05-20: qty 30

## 2022-05-20 MED ORDER — ENOXAPARIN SODIUM 40 MG/0.4ML IJ SOSY: 40.0000 mg | PREFILLED_SYRINGE | INTRAMUSCULAR | Status: DC

## 2022-05-20 MED ORDER — BUPIVACAINE-EPINEPHRINE (PF) 0.5% -1:200000 IJ SOLN
INTRAMUSCULAR | Status: AC
  Filled 2022-05-20: qty 30

## 2022-05-20 MED ORDER — LEVOTHYROXINE SODIUM 100 MCG PO TABS
137.0000 ug | ORAL_TABLET | Freq: Every day | ORAL | Status: DC
Start: 2022-05-21 — End: 2022-05-24
  Administered 2022-05-21 – 2022-05-24 (×4): 150 ug via ORAL
  Filled 2022-05-20 (×4): qty 1

## 2022-05-20 MED ORDER — LIDOCAINE HCL (CARDIAC) PF 100 MG/5ML IV SOSY
PREFILLED_SYRINGE | INTRAVENOUS | Status: DC | PRN
  Administered 2022-05-20: 100 mg via INTRAVENOUS

## 2022-05-20 MED ORDER — ONDANSETRON HCL 4 MG/2ML IJ SOLN
4.0000 mg | Freq: Once | INTRAMUSCULAR | Status: AC
  Administered 2022-05-20: 4 mg via INTRAVENOUS
  Filled 2022-05-20: qty 2

## 2022-05-20 MED ORDER — BUPIVACAINE LIPOSOME 1.3 % IJ SUSP
INTRAMUSCULAR | Status: DC | PRN
  Administered 2022-05-20: 20 mL

## 2022-05-20 MED ORDER — PROPOFOL 10 MG/ML IV BOLUS
INTRAVENOUS | Status: DC | PRN
  Administered 2022-05-20: 150 mg via INTRAVENOUS

## 2022-05-20 MED ORDER — FENTANYL CITRATE PF 50 MCG/ML IJ SOSY
25.0000 ug | PREFILLED_SYRINGE | INTRAMUSCULAR | Status: DC | PRN
  Administered 2022-05-20 (×3): 25 ug via INTRAVENOUS

## 2022-05-20 MED ORDER — FENTANYL CITRATE (PF) 100 MCG/2ML IJ SOLN
INTRAMUSCULAR | Status: AC
  Administered 2022-05-20: 25 ug
  Filled 2022-05-20: qty 2

## 2022-05-20 MED ORDER — ROCURONIUM BROMIDE 100 MG/10ML IV SOLN
INTRAVENOUS | Status: DC | PRN
  Administered 2022-05-20: 30 mg via INTRAVENOUS
  Administered 2022-05-20: 10 mg via INTRAVENOUS

## 2022-05-20 MED ORDER — PANTOPRAZOLE SODIUM 40 MG PO TBEC
40.0000 mg | DELAYED_RELEASE_TABLET | Freq: Every day | ORAL | Status: DC
  Administered 2022-05-21 – 2022-05-24 (×4): 40 mg via ORAL
  Filled 2022-05-20 (×4): qty 1

## 2022-05-20 MED ORDER — ACETAMINOPHEN 10 MG/ML IV SOLN
INTRAVENOUS | Status: DC | PRN
  Administered 2022-05-20: 1000 mg via INTRAVENOUS

## 2022-05-20 MED ORDER — MORPHINE SULFATE (PF) 4 MG/ML IV SOLN
4.0000 mg | Freq: Once | INTRAVENOUS | Status: AC
  Administered 2022-05-20: 4 mg via INTRAVENOUS
  Filled 2022-05-20: qty 1

## 2022-05-20 SURGICAL SUPPLY — 41 items
BINDER ABDOMINAL 12 ML 46-62 (SOFTGOODS) ×4 IMPLANT
BLADE SURG 15 STRL LF DISP TIS (BLADE) ×2 IMPLANT
BLADE SURG 15 STRL SS (BLADE) ×1
CANISTER WOUND CARE 500ML ATS (WOUND CARE) ×2 IMPLANT
CHLORAPREP W/TINT 26 (MISCELLANEOUS) ×3 IMPLANT
DERMABOND ADVANCED (GAUZE/BANDAGES/DRESSINGS)
DERMABOND ADVANCED .7 DNX12 (GAUZE/BANDAGES/DRESSINGS) ×1 IMPLANT
DRAPE LAPAROTOMY 100X77 ABD (DRAPES) ×3 IMPLANT
DRESSING PEEL AND PLAC PRVNA20 (GAUZE/BANDAGES/DRESSINGS) ×1 IMPLANT
DRSG PEEL AND PLACE PREVENA 20 (GAUZE/BANDAGES/DRESSINGS) ×3
ELECT REM PT RETURN 9FT ADLT (ELECTROSURGICAL) ×3
ELECTRODE REM PT RTRN 9FT ADLT (ELECTROSURGICAL) ×2 IMPLANT
GAUZE 4X4 16PLY ~~LOC~~+RFID DBL (SPONGE) ×1 IMPLANT
GAUZE SPONGE 4X4 12PLY STRL (GAUZE/BANDAGES/DRESSINGS) IMPLANT
GLOVE BIO SURGEON STRL SZ 6.5 (GLOVE) ×7 IMPLANT
GLOVE BIOGEL PI IND STRL 6.5 (GLOVE) ×4 IMPLANT
GLOVE BIOGEL PI INDICATOR 6.5 (GLOVE) ×3
GOWN STRL REUS W/ TWL LRG LVL3 (GOWN DISPOSABLE) ×4 IMPLANT
GOWN STRL REUS W/TWL LRG LVL3 (GOWN DISPOSABLE) ×3
KIT TURNOVER KIT A (KITS) ×3 IMPLANT
LABEL OR SOLS (LABEL) ×1 IMPLANT
MANIFOLD NEPTUNE II (INSTRUMENTS) ×3 IMPLANT
NDL HYPO 25X1 1.5 SAFETY (NEEDLE) ×1 IMPLANT
NEEDLE HYPO 25X1 1.5 SAFETY (NEEDLE) ×3 IMPLANT
NS IRRIG 500ML POUR BTL (IV SOLUTION) ×3 IMPLANT
PACK BASIN MINOR ARMC (MISCELLANEOUS) ×3 IMPLANT
SPIKE FLUID TRANSFER (MISCELLANEOUS) ×2 IMPLANT
SPONGE T-LAP 18X18 ~~LOC~~+RFID (SPONGE) ×2 IMPLANT
STAPLER SKIN PROX 35W (STAPLE) ×2 IMPLANT
SUT MNCRL 4-0 (SUTURE)
SUT MNCRL 4-0 27XMFL (SUTURE)
SUT PDS PLUS 0 (SUTURE)
SUT PDS PLUS AB 0 CT-2 (SUTURE) ×1 IMPLANT
SUT PROLENE 0 CT 2 (SUTURE) ×2 IMPLANT
SUT STRATAFIX SPIRAL PDS+ 0 30 (SUTURE) ×6 IMPLANT
SUT VIC AB 2-0 SH 27 (SUTURE)
SUT VIC AB 2-0 SH 27XBRD (SUTURE) ×1 IMPLANT
SUTURE MNCRL 4-0 27XMF (SUTURE) ×1 IMPLANT
SYR 10ML LL (SYRINGE) ×3 IMPLANT
SYR 30ML LL (SYRINGE) ×4 IMPLANT
WATER STERILE IRR 500ML POUR (IV SOLUTION) ×3 IMPLANT

## 2022-05-20 NOTE — Anesthesia Preprocedure Evaluation (Signed)
Anesthesia Evaluation  Patient identified by MRN, date of birth, ID band Patient awake    Reviewed: Allergy & Precautions, H&P , NPO status , Patient's Chart, lab work & pertinent test results, reviewed documented beta blocker date and time   History of Anesthesia Complications Negative for: history of anesthetic complications  Airway Mallampati: II  TM Distance: >3 FB Neck ROM: full    Dental  (+) Dental Advidsory Given, Caps, Teeth Intact   Pulmonary neg pulmonary ROS,    Pulmonary exam normal breath sounds clear to auscultation       Cardiovascular Exercise Tolerance: Good hypertension, (-) angina+ CAD  (-) Past MI, (-) Cardiac Stents and (-) CABG Normal cardiovascular exam(-) dysrhythmias (-) Valvular Problems/Murmurs Rhythm:regular Rate:Normal     Neuro/Psych PSYCHIATRIC DISORDERS Depression negative neurological ROS     GI/Hepatic Neg liver ROS, GERD  ,  Endo/Other  negative endocrine ROS  Renal/GU negative Renal ROS  negative genitourinary   Musculoskeletal   Abdominal   Peds  Hematology negative hematology ROS (+)   Anesthesia Other Findings Past Medical History: No date: GERD (gastroesophageal reflux disease) No date: Large bowel obstruction (HCC) No date: Obesity No date: Thyroid disease No date: Ventral hernia   Reproductive/Obstetrics negative OB ROS                             Anesthesia Physical Anesthesia Plan  ASA: 2 and emergent  Anesthesia Plan: General   Post-op Pain Management:    Induction: Intravenous, Rapid sequence and Cricoid pressure planned  PONV Risk Score and Plan: 3 and Ondansetron, Dexamethasone, Midazolam and Treatment may vary due to age or medical condition  Airway Management Planned: Oral ETT  Additional Equipment:   Intra-op Plan:   Post-operative Plan: Extubation in OR  Informed Consent: I have reviewed the patients History and  Physical, chart, labs and discussed the procedure including the risks, benefits and alternatives for the proposed anesthesia with the patient or authorized representative who has indicated his/her understanding and acceptance.     Dental Advisory Given  Plan Discussed with: Anesthesiologist, CRNA and Surgeon  Anesthesia Plan Comments:         Anesthesia Quick Evaluation

## 2022-05-20 NOTE — ED Notes (Signed)
Pt to OR with OR tech.

## 2022-05-20 NOTE — ED Provider Notes (Signed)
[facility] Provider Note    Event Date/Time   First MD Initiated Contact with Patient 05/20/22 1732     (approximate)  History   Chief Complaint: Abdominal Pain  HPI  Bridget Williams is a 67 y.o. female with a past medical history of a prior bowel obstruction ventral hernia status postrepair in 2017, gastric reflux, presents to the emergency department for abdominal pain.  Cording to the patient for the past 7 to 10 days she has been experiencing abdominal pain initially more around her bellybutton but has progressed to include most of her abdomen.  States it feels like her abdomen is swollen as well.  For the past 2 days she has been experiencing intermittent nausea with episodes of vomiting as well as diarrhea.  No fever.  Patient does state some urinary incontinence at times but denies any dysuria.  She states that is atypical.  Physical Exam   Triage Vital Signs: ED Triage Vitals  Enc Vitals Group     BP 05/20/22 1314 (!) 150/114     Pulse Rate 05/20/22 1314 97     Resp 05/20/22 1314 16     Temp 05/20/22 1314 98.7 F (37.1 C)     Temp Source 05/20/22 1314 Oral     SpO2 05/20/22 1314 95 %     Weight 05/20/22 1330 205 lb (93 kg)     Height 05/20/22 1330 5' (1.524 m)     Head Circumference --      Peak Flow --      Pain Score 05/20/22 1330 8     Pain Loc --      Pain Edu? --      Excl. in GC? --     Most recent vital signs: Vitals:   05/20/22 1741 05/20/22 1743  BP: (!) 151/66   Pulse: 97   Resp: 16   Temp:  98 F (36.7 C)  SpO2: 97%     General: Awake, no distress.  CV:  Good peripheral perfusion.  Regular rate and rhythm  Resp:  Normal effort.  Equal breath sounds bilaterally.  Abd:  Mild distention, moderate tenderness just above the umbilicus extending into the upper abdomen.  No rebound or guarding.    ED Results / Procedures / Treatments   RADIOLOGY  I have reviewed and interpreted the CT images patient appears to have a  large ventral hernia containing intestine. Radiology is read the CT scan as ventral hernia with possible bowel obstruction.   MEDICATIONS ORDERED IN ED: Medications - No data to display   IMPRESSION / MDM / ASSESSMENT AND PLAN / ED COURSE  I reviewed the triage vital signs and the nursing notes.  Patient's presentation is most consistent with acute presentation with potential threat to life or bodily function.  Patient presents emergency department for abdominal pain and now 2 days of vomiting and diarrhea as well.  Last had an episode of diarrhea approximately 1 hour ago per patient.  Patient does have a moderately tender abdomen although not an acute abdomen.  Patient has significant leukocytosis on her CBC, chemistry is overall reassuring.  Urinalysis is consistent with urinary tract infection.  We will start the patient on IV Rocephin we will send a urine culture.  CT scan of the abdomen shows a ventral hernia with concerns for obstruction.  I spoke to Dr. Maia Plan of general surgery who has reviewed the CT images and will come see the patient.  We will admit  to the hospital service for ongoing management with surgery consulting to decide if the patient needs to go the OR.  Patient agreeable to plan of care.   FINAL CLINICAL IMPRESSION(S) / ED DIAGNOSES   Bowel obstruction Ventral hernia Urinary tract infection   Note:  This document was prepared using Dragon voice recognition software and may include unintentional dictation errors.   Minna Antis, MD 05/20/22 1752

## 2022-05-20 NOTE — H&P (Signed)
[place]   PATIENT NAME: Bridget Williams    MR#:  [id number]  DATE OF BIRTH:  06/02/1956  DATE OF ADMISSION:  05/20/2022  PRIMARY CARE PHYSICIAN: Mickey Farber, MD   Patient is coming from: Home  REQUESTING/REFERRING PHYSICIAN: Minna Antis, MD  CHIEF COMPLAINT:   Chief Complaint  Patient presents with   Abdominal Pain    HISTORY OF PRESENT ILLNESS:  Bridget Williams is a 66 y.o. female with medical history significant for GERD, obesity, hypothyroidism and ventral hernia, who presented to the ER with acute onset of worsening abdominal pain over the last couple of days with associated intractable nausea and vomiting.  She had a bowel movement with diarrhea today.  She denies any fever or chills.  No chest pain or dyspnea or cough or wheezing.  She admitted to dysuria without urinary frequency or urgency or hematuria or flank pain.  No headache or dizziness or blurred vision.  No melena or bright red bleeding per rectum.  She denies any bilious vomitus or hematemesis.  The patient was seen in the PACU postoperatively.  ED Course: When she came to the ER, BP was 150/114 with otherwise normal vital signs.  Labs revealed unremarkable CMP.  CBC showed leukocytosis of 21 with neutrophilia.  UA was positive for UTI.  Urine culture was sent.  Imaging: Abdominal pelvic CT scan without contrast revealed the following: 1. Large broad-based ventral abdominal wall hernia containing small bowel and transverse colon. Mildly dilated small bowel entering the hernia with decompressed small bowel exiting the hernia consistent with obstruction. Mesenteric stranding and small bowel wall thickening may indicate changes of edema or ischemia and suggest incarceration. No pneumatosis. 2. Nonobstructing intrarenal stones in the right kidney. 3. Cholelithiasis without evidence of acute cholecystitis. 4. Large esophageal hiatal hernia.  The patient was given 4 mg IV morphine sulfate, 4 mg of  IV Zofran, 1 L bolus of IV normal saline and 1 g of IV Rocephin.  Dr. Maia Plan was notified about the patient and took her emergently to the OR for repair of incarcerated recurrent ventral hernia.  She will be admitted to a medical telemetry bed for further evaluation and management. PAST MEDICAL HISTORY:   Past Medical History:  Diagnosis Date   GERD (gastroesophageal reflux disease)    Large bowel obstruction (HCC)    Obesity    Thyroid disease    Ventral hernia     PAST SURGICAL HISTORY:   Past Surgical History:  Procedure Laterality Date   ABDOMINAL HYSTERECTOMY     BREAST EXCISIONAL BIOPSY Bilateral 1978   INGUINAL HERNIA REPAIR  01/05/2016   Procedure: HERNIA REPAIR INGUINAL INCARCERATED;  Surgeon: Lattie Haw, MD;  Location: [facility] ORS;  Service: General;;   LAPAROTOMY N/A 01/05/2016   Procedure: EXPLORATORY LAPAROTOMY;  Surgeon: Lattie Haw, MD;  Location: [facility] ORS;  Service: General;  Laterality: N/A;    SOCIAL HISTORY:   Social History   Tobacco Use   Smoking status: Never   Smokeless tobacco: Never  Substance Use Topics   Alcohol use: No    Alcohol/week: 0.0 standard drinks of alcohol    FAMILY HISTORY:   Family History  Problem Relation Age of Onset   Breast cancer Mother 47   Breast cancer Paternal Aunt     DRUG ALLERGIES:  No Known Allergies  REVIEW OF SYSTEMS:   ROS As per history of present illness. All pertinent systems were reviewed above. Constitutional, HEENT, cardiovascular, respiratory, GI,  GU, musculoskeletal, neuro, psychiatric, endocrine, integumentary and hematologic systems were reviewed and are otherwise negative/unremarkable except for positive findings mentioned above in the HPI.   MEDICATIONS AT HOME:   Prior to Admission medications   Medication Sig Start Date End Date Taking? Authorizing Provider  acetaminophen (TYLENOL) 500 MG tablet Take 1,000 mg by mouth every 6 (six) hours as needed for mild pain or headache.     [provider]  aspirin EC 81 MG tablet Take 81 mg by mouth daily.    [provider]  buPROPion (WELLBUTRIN SR) 150 MG 12 hr tablet Take 150 mg by mouth 2 (two) times daily.    [provider]  levothyroxine (SYNTHROID, LEVOTHROID) 100 MCG tablet Take 100 mcg by mouth daily before breakfast.    [provider]  Multiple Vitamin (MULTIVITAMIN WITH MINERALS) TABS tablet Take 1 tablet by mouth daily.    [provider]  pantoprazole (PROTONIX) 40 MG tablet Take 40 mg by mouth daily.    [provider]  Vitamin D, Ergocalciferol, (DRISDOL) 50000 units CAPS capsule Take 50,000 Units by mouth every 30 (thirty) days. Pt takes on the 1st of every month.    [provider]      VITAL SIGNS:  Blood pressure 138/68, pulse (!) 110, temperature (!) 97 F (36.1 C), resp. rate 19, height 5' (1.524 m), weight 93 kg, SpO2 94 %.  PHYSICAL EXAMINATION:  Physical Exam  GENERAL:  66 y.o.-year-old patient lying in the bed with no acute distress.  She was somnolent but easily arousable postoperatively. EYES: Pupils equal, round, reactive to light and accommodation. No scleral icterus. Extraocular muscles intact.  HEENT: Head atraumatic, normocephalic. Oropharynx and nasopharynx clear.  NECK:  Supple, no jugular venous distention. No thyroid enlargement, no tenderness.  LUNGS: Normal breath sounds bilaterally, no wheezing, rales,rhonchi or crepitation. No use of accessory muscles of respiration.  CARDIOVASCULAR: Regular rate and rhythm, S1, S2 normal. No murmurs, rubs, or gallops.  ABDOMEN: Soft, mildly distended with expected postoperative tenderness.  Bowel sounds were diminished.  No organomegaly or mass.  EXTREMITIES: No pedal edema, cyanosis, or clubbing.  NEUROLOGIC: Cranial nerves II through XII are intact. Muscle strength 5/5 in all extremities. Sensation intact. Gait not checked.  PSYCHIATRIC: The patient is alert and oriented x 3.  Normal  affect and good eye contact. SKIN: No obvious rash, lesion, or ulcer.   LABORATORY PANEL:   CBC Recent Labs  Lab 05/20/22 1331  WBC 21.0*  HGB 12.9  HCT 42.2  PLT 519*   ------------------------------------------------------------------------------------------------------------------  Chemistries  Recent Labs  Lab 05/20/22 1331  NA 143  K 4.3  CL 103  CO2 29  GLUCOSE 111*  BUN 12  CREATININE 1.10*  CALCIUM 9.6  AST 24  ALT 24  ALKPHOS 104  BILITOT 1.1   ------------------------------------------------------------------------------------------------------------------  Cardiac Enzymes No results for input(s): "TROPONINI" in the last 168 hours. ------------------------------------------------------------------------------------------------------------------  RADIOLOGY:  CT ABDOMEN PELVIS WO CONTRAST  Result Date: 05/20/2022 CLINICAL DATA:  Acute nonlocalized abdominal pain. Left-sided abdominal pain since last week. Diarrhea. EXAM: CT ABDOMEN AND PELVIS WITHOUT CONTRAST TECHNIQUE: Multidetector CT imaging of the abdomen and pelvis was performed following the standard protocol without IV contrast. RADIATION DOSE REDUCTION: This exam was performed according to the departmental dose-optimization program which includes automated exposure control, adjustment of the mA and/or kV according to patient size and/or use of iterative reconstruction technique. COMPARISON:  01/05/2016 FINDINGS: Lower chest: Lung bases are clear. Large esophageal hiatal hernia behind  the heart. Moderately prominent retrocrural lymph nodes, nonspecific but likely reactive. Hepatobiliary: No focal liver lesions. Cholelithiasis with multiple stones in the gallbladder. No gallbladder wall thickening. Bile ducts are not dilated. Pancreas: Unremarkable. No pancreatic ductal dilatation or surrounding inflammatory changes. Spleen: Normal in size without focal abnormality. Adrenals/Urinary Tract: No adrenal gland  nodules. Multiple stones in the right kidney. Largest measuring about 3 mm in diameter. No hydronephrosis or hydroureter. No ureteral stones are seen. Left kidney, left ureter, and the bladder are unremarkable. Stomach/Bowel: Large esophageal hiatal hernia. Stomach is decompressed. There is a large broad-based ventral abdominal wall hernia containing multiple loops of small bowel and transverse colon. Proximal small bowel are mildly dilated with evidence of diffuse wall thickening. There is however a transition zone within the hernia with decompressed bowel distally. Prominent stranding in the mesenteric fat and in the hernia. Colon is decompressed. Scattered colonic diverticula without evidence of acute diverticulitis. The appendix is normal. Vascular/Lymphatic: No significant vascular findings are present. No enlarged abdominal or pelvic lymph nodes. Reproductive: Status post hysterectomy. No adnexal masses. Other: No free air or free fluid in the abdomen. Musculoskeletal: No acute or significant osseous findings. IMPRESSION: 1. Large broad-based ventral abdominal wall hernia containing small bowel and transverse colon. Mildly dilated small bowel entering the hernia with decompressed small bowel exiting the hernia consistent with obstruction. Mesenteric stranding and small bowel wall thickening may indicate changes of edema or ischemia and suggest incarceration. No pneumatosis. 2. Nonobstructing intrarenal stones in the right kidney. 3. Cholelithiasis without evidence of acute cholecystitis. 4. Large esophageal hiatal hernia. Electronically Signed   By: Burman Nieves M.D.   On: 05/20/2022 17:23      IMPRESSION AND PLAN:  Assessment and Plan: * Sepsis due to gram-negative UTI [facility]) - The patient be admitted to a medical telemetry bed. - We will place on IV Rocephin and follow blood and urine cultures. - She will be hydrated with IV normal saline. - Her sepsis manifested by leukocytosis and  tachycardia.  Incarcerated ventral hernia - This is likely the reason for her abdominal pain, nausea and vomiting. - She will be kept NPO. - General surgery consultation will be obtained. - Dr. Maia Plan was notified and is aware about the patient.  Will defer to him urgent surgical intervention given the suspicion about obstructed and incarcerated ventral hernia.  Hypothyroidism - We will continue Synthroid.  While she is not able to take p.o. we will convert to IV and give half the home dose.  GERD without esophagitis - We will continue PPI therapy.  Depression - We will continue when feasible her Zoloft and Wellbutrin XL  Essential hypertension - We will continue her antihypertensives. - She will be placed on as needed IV labetalol pending p.o. intake   DVT prophylaxis: Lovenox.  Advanced Care Planning:  Code Status: full code.  Family Communication:  The plan of care was discussed in details with the patient (and family). I answered all questions. The patient agreed to proceed with the above mentioned plan. Further management will depend upon hospital course. Disposition Plan: Back to previous home environment Consults called: General surgery All the records are reviewed and case discussed with ED provider.  Status is: Inpatient    At the time of the admission, it appears that the appropriate admission status for this patient is inpatient.  This is judged to be reasonable and necessary in order to provide the required intensity of service to ensure the patient's safety given the presenting  symptoms, physical exam findings and initial radiographic and laboratory data in the context of comorbid conditions.  The patient requires inpatient status due to high intensity of service, high risk of further deterioration and high frequency of surveillance required.  I certify that at the time of admission, it is my clinical judgment that the patient will require inpatient hospital care  extending more than 2 midnights.                            Dispo: The patient is from: Home              Anticipated d/c is to: Home              Patient currently is not medically stable to d/c.              Difficult to place patient: No  Hannah Beat M.D on 05/20/2022 at 9:33 PM  [facility]   From 7 PM-7 AM, contact night-coverage [email or web address]  CC: Primary care physician; Mickey Farber, MD

## 2022-05-20 NOTE — ED Triage Notes (Signed)
Pt in with co left sided abd pian since last week. States now pain has worsened, has had diarrhea.

## 2022-05-20 NOTE — Assessment & Plan Note (Signed)
-   The patient be admitted to a medical telemetry bed. - We will place on IV Rocephin and follow blood and urine cultures. - She will be hydrated with IV normal saline. - Her sepsis manifested by leukocytosis and tachycardia.

## 2022-05-20 NOTE — Assessment & Plan Note (Addendum)
-   We will continue Synthroid.  While she is not able to take p.o. we will convert to IV and give half the home dose.

## 2022-05-20 NOTE — Assessment & Plan Note (Addendum)
-   This is likely the reason for her abdominal pain, nausea and vomiting. - She will be kept NPO. - General surgery consultation will be obtained. - Dr. Maia Plan was notified and is aware about the patient.  Will defer to him urgent surgical intervention given the suspicion about obstructed and incarcerated ventral hernia.

## 2022-05-20 NOTE — Anesthesia Procedure Notes (Signed)
Procedure Name: Intubation Date/Time: 05/20/2022 7:12 PM  Performed by: Waldo Laine, CRNAPre-anesthesia Checklist: Patient identified, Patient being monitored, Timeout performed, Emergency Drugs available and Suction available Patient Re-evaluated:Patient Re-evaluated prior to induction Oxygen Delivery Method: Circle system utilized Preoxygenation: Pre-oxygenation with 100% oxygen Induction Type: IV induction and Rapid sequence Laryngoscope Size: 3 and McGraph Grade View: Grade I Tube type: Oral Tube size: 7.0 mm Number of attempts: 1 Airway Equipment and Method: Stylet Placement Confirmation: ETT inserted through vocal cords under direct vision, positive ETCO2 and breath sounds checked- equal and bilateral Secured at: 21 cm Tube secured with: Tape Dental Injury: Teeth and Oropharynx as per pre-operative assessment  Future Recommendations: Recommend- induction with short-acting agent, and alternative techniques readily available Comments: Small mouth, anterior

## 2022-05-20 NOTE — ED Provider Triage Note (Signed)
Emergency Medicine Provider Triage Evaluation Note  Bridget Williams , a 66 y.o. female  was evaluated in triage.  Pt complains of abdominal pain, right lower quadrant, started left lower quadrant last week was placed on penicillin.  Review of Systems  Positive: Abdominal pain Negative: Fever  Physical Exam  BP (!) 150/114 (BP Location: Left Arm)   Pulse 97   Temp 98.7 F (37.1 C) (Oral)   Resp 16   Ht 5' (1.524 m)   Wt 93 kg   SpO2 95%   BMI 40.04 kg/m  Gen:   Awake, no distress   Resp:  Normal effort  MSK:   Moves extremities without difficulty  Other:    Medical Decision Making  Medically screening exam initiated at 1:31 PM.  Appropriate orders placed.  Bridget Williams was informed that the remainder of the evaluation will be completed by another provider, this initial triage assessment does not replace that evaluation, and the importance of remaining in the ED until their evaluation is complete.  Encourage nurses started IV for CT abdomen pelvis.   Bridget Ghee, PA-C 05/20/22 1332

## 2022-05-20 NOTE — Assessment & Plan Note (Signed)
-   We will continue her antihypertensives. - She will be placed on as needed IV labetalol pending p.o. intake

## 2022-05-20 NOTE — Consult Note (Signed)
SURGICAL CONSULTATION NOTE   HISTORY OF PRESENT ILLNESS (HPI):  66 y.o. female presented to [facility] ED for evaluation of abdominal pain. Patient reports she started having abdominal pain since a week ago.  Initially pain on the right lower quadrant, then pain radiated to the left lower quadrant.  Since the last 2 to 3 days she started developing nausea and vomiting.  She also endorses having diarrhea.  Today she feels the urge of vomiting but nothing is coming out.  She cannot identify any specific alleviating or aggravating factors.  At the ED she was found with abdominal pain, no specific area of tenderness on the physical exam.  There is marked leukocytosis of 21,000.  No significant electrolyte disturbance.  She had a CT scan of the abdomen and pelvis that shows large ventral hernia with proximal dilation of the small intestine and distal decompression with mesenteric congestion.  No free air or free fluid.  I personally evaluated the images.  Surgery is consulted by Dr. Lenard Lance in this context for evaluation and management of intestinal obstruction due to ventral hernia.  PAST MEDICAL HISTORY (PMH):  Past Medical History:  Diagnosis Date   GERD (gastroesophageal reflux disease)    Large bowel obstruction (HCC)    Obesity    Thyroid disease    Ventral hernia      PAST SURGICAL HISTORY (PSH):  Past Surgical History:  Procedure Laterality Date   ABDOMINAL HYSTERECTOMY     BREAST EXCISIONAL BIOPSY Bilateral 1978   INGUINAL HERNIA REPAIR  01/05/2016   Procedure: HERNIA REPAIR INGUINAL INCARCERATED;  Surgeon: Lattie Haw, MD;  Location: [facility] ORS;  Service: General;;   LAPAROTOMY N/A 01/05/2016   Procedure: EXPLORATORY LAPAROTOMY;  Surgeon: Lattie Haw, MD;  Location: [facility] ORS;  Service: General;  Laterality: N/A;     MEDICATIONS:  Prior to Admission medications   Medication Sig Start Date End Date Taking? Authorizing Provider  acetaminophen (TYLENOL) 500 MG tablet Take  1,000 mg by mouth every 6 (six) hours as needed for mild pain or headache.    [provider]  aspirin EC 81 MG tablet Take 81 mg by mouth daily.    [provider]  buPROPion (WELLBUTRIN SR) 150 MG 12 hr tablet Take 150 mg by mouth 2 (two) times daily.    [provider]  levothyroxine (SYNTHROID, LEVOTHROID) 100 MCG tablet Take 100 mcg by mouth daily before breakfast.    [provider]  Multiple Vitamin (MULTIVITAMIN WITH MINERALS) TABS tablet Take 1 tablet by mouth daily.    [provider]  pantoprazole (PROTONIX) 40 MG tablet Take 40 mg by mouth daily.    [provider]  Vitamin D, Ergocalciferol, (DRISDOL) 50000 units CAPS capsule Take 50,000 Units by mouth every 30 (thirty) days. Pt takes on the 1st of every month.    [provider]     ALLERGIES:  No Known Allergies   SOCIAL HISTORY:  Social History   Socioeconomic History   Marital status: Widowed    Spouse name: Not on file   Number of children: Not on file   Years of education: Not on file   Highest education level: Not on file  Occupational History   Not on file  Tobacco Use   Smoking status: Never   Smokeless tobacco: Never  Substance and Sexual Activity   Alcohol use: No    Alcohol/week: 0.0 standard drinks of alcohol   Drug use: No   Sexual activity: Not on  file  Other Topics Concern   Not on file  Social History Narrative   Not on file   Social Determinants of Health   Financial Resource Strain: Not on file  Food Insecurity: Not on file  Transportation Needs: Not on file  Physical Activity: Not on file  Stress: Not on file  Social Connections: Not on file  Intimate Partner Violence: Not on file      FAMILY HISTORY:  Family History  Problem Relation Age of Onset   Breast cancer Mother 48   Breast cancer Paternal Aunt      REVIEW OF SYSTEMS:  Constitutional: denies weight loss, fever, chills, or sweats  Eyes: denies any other  vision changes, history of eye injury  ENT: denies sore throat, hearing problems  Respiratory: denies shortness of breath, wheezing  Cardiovascular: denies chest pain, palpitations  Gastrointestinal: Positive abdominal pain, nausea and vomiting Genitourinary: denies burning with urination or urinary frequency Musculoskeletal: denies any other joint pains or cramps  Skin: denies any other rashes or skin discolorations  Neurological: denies any other headache, dizziness, weakness  Psychiatric: denies any other depression, anxiety   All other review of systems were negative   VITAL SIGNS:  Temp:  [98 F (36.7 C)-98.7 F (37.1 C)] 98 F (36.7 C) (07/24 1743) Pulse Rate:  [97] 97 (07/24 1741) Resp:  [16] 16 (07/24 1741) BP: (150-151)/(66-114) 151/66 (07/24 1741) SpO2:  [95 %-97 %] 97 % (07/24 1741) Weight:  [93 kg] 93 kg (07/24 1330)     Height: 5' (152.4 cm) Weight: 93 kg BMI (Calculated): 40.04   INTAKE/OUTPUT:  This shift: No intake/output data recorded.  Last 2 shifts: @IOLAST2SHIFTS @   PHYSICAL EXAM:  Constitutional:  -- Normal body habitus  -- Awake, alert, and oriented x3  Eyes:  -- Pupils equally round and reactive to light  -- No scleral icterus  Ear, nose, and throat:  -- No jugular venous distension  Pulmonary:  -- No crackles  -- Equal breath sounds bilaterally -- Breathing non-labored at rest Cardiovascular:  -- S1, S2 present  -- No pericardial rubs Gastrointestinal:  -- Abdomen soft, moderate tender in right side of the abdomen, non-distended, no guarding or rebound tenderness -- Morbidly obese Musculoskeletal and Integumentary:  -- Wounds: None appreciated -- Extremities: B/L UE and LE FROM, hands and feet warm, no edema  Neurologic:  -- Motor function: intact and symmetric -- Sensation: intact and symmetric   Labs:     Latest Ref Rng & Units 05/20/2022    1:31 PM 01/07/2016    8:43 AM 01/06/2016    6:04 AM  CBC  WBC 4.0 - 10.5 K/uL 21.0  10.2   20.0   Hemoglobin 12.0 - 15.0 g/dL 03/07/2016  97.9  89.2   Hematocrit 36.0 - 46.0 % 42.2  34.1  36.4   Platelets 150 - 400 K/uL 519  231  269       Latest Ref Rng & Units 05/20/2022    1:31 PM 01/07/2016    8:43 AM 01/06/2016    6:04 AM  CMP  Glucose 70 - 99 mg/dL 03/07/2016  417  408   BUN 8 - 23 mg/dL 12  11  13    Creatinine 0.44 - 1.00 mg/dL 144   8.18   Sodium 135 - 145 mmol/L 143  140  136   Potassium 3.5 - 5.1 mmol/L 4.3  3.9  4.1   Chloride 98 - 111 mmol/L 103  106  104  CO2 22 - 32 mmol/L 29  26  24    Calcium 8.9 - 10.3 mg/dL 9.6  8.4  8.9   Total Protein 6.5 - 8.1 g/dL 7.8     Total Bilirubin 0.3 - 1.2 mg/dL 1.1     Alkaline Phos 38 - 126 U/L 104     AST 15 - 41 U/L 24     ALT 0 - 44 U/L 24        Imaging studies:  EXAM: CT ABDOMEN AND PELVIS WITHOUT CONTRAST   TECHNIQUE: Multidetector CT imaging of the abdomen and pelvis was performed following the standard protocol without IV contrast.   RADIATION DOSE REDUCTION: This exam was performed according to the departmental dose-optimization program which includes automated exposure control, adjustment of the mA and/or kV according to patient size and/or use of iterative reconstruction technique.   COMPARISON:  01/05/2016   FINDINGS: Lower chest: Lung bases are clear. Large esophageal hiatal hernia behind the heart. Moderately prominent retrocrural lymph nodes, nonspecific but likely reactive.   Hepatobiliary: No focal liver lesions. Cholelithiasis with multiple stones in the gallbladder. No gallbladder wall thickening. Bile ducts are not dilated.   Pancreas: Unremarkable. No pancreatic ductal dilatation or surrounding inflammatory changes.   Spleen: Normal in size without focal abnormality.   Adrenals/Urinary Tract: No adrenal gland nodules. Multiple stones in the right kidney. Largest measuring about 3 mm in diameter. No hydronephrosis or hydroureter. No ureteral stones are seen. Left kidney, left ureter, and  the bladder are unremarkable.   Stomach/Bowel: Large esophageal hiatal hernia. Stomach is decompressed. There is a large broad-based ventral abdominal wall hernia containing multiple loops of small bowel and transverse colon. Proximal small bowel are mildly dilated with evidence of diffuse wall thickening. There is however a transition zone within the hernia with decompressed bowel distally. Prominent stranding in the mesenteric fat and in the hernia. Colon is decompressed. Scattered colonic diverticula without evidence of acute diverticulitis. The appendix is normal.   Vascular/Lymphatic: No significant vascular findings are present. No enlarged abdominal or pelvic lymph nodes.   Reproductive: Status post hysterectomy. No adnexal masses.   Other: No free air or free fluid in the abdomen.   Musculoskeletal: No acute or significant osseous findings.   IMPRESSION: 1. Large broad-based ventral abdominal wall hernia containing small bowel and transverse colon. Mildly dilated small bowel entering the hernia with decompressed small bowel exiting the hernia consistent with obstruction. Mesenteric stranding and small bowel wall thickening may indicate changes of edema or ischemia and suggest incarceration. No pneumatosis. 2. Nonobstructing intrarenal stones in the right kidney. 3. Cholelithiasis without evidence of acute cholecystitis. 4. Large esophageal hiatal hernia.     Electronically Signed   By: 03/06/2016 M.D.   On: 05/20/2022 17:23  Assessment/Plan:  66 y.o. female with recurrent ventral hernia with small bowel obstruction, complicated by pertinent comorbidities including morbidly obese.  Patient with recurrent ventral hernia with small bowel obstruction.  Physical exam concerning with pain on palpation on the right side of the abdomen.  Patient has a leukocytosis of 21,000.  CT scan shows mesentery edema concerning for early congestion versus ischemia.  Patient also has  UTI which can explain the lower abdominal pain and leukocytosis but with the CT scan findings I think that we should not take the risk of strangulation.  I think that with tenderness on palpation, leukocytosis, edema of the intestine, obstructive pattern, decompression of the distal intestine which will make sure that there is no bowel  strangulation.  Patient will benefit of exploratory laparotomy with reduction of hernia contents and primary repair of recurrent ventral hernia.  I discussed with patient that the goal of the surgery is to rule out small bowel strangulation and resolution of the obstruction and avoid further complication of the incarcerated ventral hernia with obstruction.  Patient was admitted about the risk of surgery that includes infection, bleeding, pain, injury to intestine, possible need of intestinal resection with anastomosis, possible anastomosis leak, high risk of recurrence of hernia, among others.  Patient also with UTI.  She was started on antibiotic therapy as per hospitalist.  All of the above findings and recommendations were discussed with the patient and her family, and all of patient's and her family's questions were answered to their expressed satisfaction.  Gae Gallop, MD

## 2022-05-20 NOTE — Transfer of Care (Signed)
Immediate Anesthesia Transfer of Care Note  Patient: Bridget Williams  Procedure(s) Performed: INCARCERATED VENTRAL HERNIA REPAIR (Abdomen)  Patient Location: PACU  Anesthesia Type:General  Level of Consciousness: sedated and patient cooperative  Airway & Oxygen Therapy: Patient Spontanous Breathing and Patient connected to face mask oxygen  Post-op Assessment: Report given to RN and Post -op Vital signs reviewed and stable  Post vital signs: Reviewed and stable  Last Vitals:  Vitals Value Taken Time  BP 134/114 05/20/22 2110  Temp    Pulse 117 05/20/22 2113  Resp 23 05/20/22 2113  SpO2 97 % 05/20/22 2113  Vitals shown include unvalidated device data.  Last Pain:  Vitals:   05/20/22 1743  TempSrc: Axillary  PainSc:          Complications: No notable events documented.

## 2022-05-20 NOTE — Op Note (Signed)
Preoperative diagnosis: Recurrent ventral hernia with obstruction.  Postoperative diagnosis: Recurrent Ventral hernia with obstruction.  Procedure: Repair of recurrent ventral hernia with small bowel obstruction                     Non durable negative pressure dressing placement                      Anesthesia: GETA  Surgeon: Dr. Hazle Quant  Wound Classification: Clean  Indications:Patient is a 66 y.o. female developed a recurrent ventral hernia in the site of a previous incision.  Patient presented with nausea and vomiting for the last 2 or 3 days with these incarcerated hernia with tenderness to palpation and elevated white blood cell count concerning for strangulation.    Findings: 1. A 15 cm x 10 cm ventral hernia with incarcerated small bowel 2.  Multiple small stitches hernia inside the large incisional hernia. 3. No hollow viscus organ injury identified during procedure 4. Adequate hemostasis  Description of procedure: The patient was brought to the operating room and general anesthesia was induced. A time-out was completed verifying correct patient, procedure, site, positioning, and implant(s) and/or special equipment prior to beginning this procedure. Antibiotics were administered prior to making the incision. The anterior abdominal wall was prepped and draped in the standard sterile fashion. A vertical midline incision incorporating the old incision was made. The incision was deepened to the fascia. The hernia sac was then identified and dissected free. The peritoneum of the sac was entered and the contents were reduced.  A very difficult and time-consuming lysis of adhesions with Metzenbaum was done.  The fascia was carefully palpated and multiple additional defects were identified. Intervening fascial bridges were cut to create a single defect.  This way I was able to reduce the incarcerated bowel in one of the pockets of smaller hernias. Adhesions to the underside of the  abdominal wall were lysed and the fascia was assessed.  A very debilitated midline fascia was identified.  There was a wide base ventral hernia with abundant amount of scar tissue.  A combination of closure of the anterior abdominal wall with bridging with the hernia sac was needed to be done to be able to close the abdominal wall.  I decided not to proceed with mesh due to contamination from the edema of the incarcerated small bowel. The abdominal wall was closed with 0 STRATAFIX.  The skin was closed with staples.  The Non DME negative pressure dressing was applied to the 15 x 1 cm wound. The patient tolerated the procedure well and was brought to the postanesthesia care unit in stable condition.   Specimen: Hernia sac  Complications: None  Estimated Blood Loss: 25 mL

## 2022-05-20 NOTE — Assessment & Plan Note (Addendum)
-   We will continue when feasible her Zoloft and Wellbutrin XL

## 2022-05-20 NOTE — Assessment & Plan Note (Signed)
-   We will continue PPI therapy 

## 2022-05-20 NOTE — Progress Notes (Signed)
PHARMACIST - PHYSICIAN COMMUNICATION  CONCERNING:  Enoxaparin (Lovenox) for DVT Prophylaxis    RECOMMENDATION: Patient was prescribed enoxaprin 40mg  q24 hours for VTE prophylaxis.   Filed Weights   05/20/22 1330  Weight: 93 kg (205 lb)    Body mass index is 40.04 kg/m.  Estimated Creatinine Clearance: 52.6 mL/min (A) (by C-G formula based on SCr of 1.1 mg/dL (H)).   Based on [facility] policy patient is candidate for enoxaparin 0.5mg /kg TBW SQ every 24 hours based on BMI being >30.   DESCRIPTION: Pharmacy has adjusted enoxaparin dose per [facility] policy.  Patient is now receiving enoxaparin 47.5 mg every 24 hours    [facility], PharmD Clinical Pharmacist  05/20/2022 6:15 PM

## 2022-05-20 NOTE — ED Notes (Signed)
EDP at bedside examining pt. Obtaining VS.

## 2022-05-20 NOTE — ED Notes (Signed)
Pt to ED for RLQ abdominal pain. Pain started in LLQ about 10 days ago and had fever first few days (100-102 F) then moved to RLQ. Has had diarrhea for 3 days. Brown color, 3 times per day. Was vomiting since 2-3 days ago, dry heaving today.

## 2022-05-20 NOTE — ED Notes (Signed)
Pt to OR with OR tech report given to OR RN

## 2022-05-21 ENCOUNTER — Encounter: Payer: Self-pay | Admitting: General Surgery

## 2022-05-21 DIAGNOSIS — N390 Urinary tract infection, site not specified: Secondary | ICD-10-CM | POA: Diagnosis not present

## 2022-05-21 DIAGNOSIS — A4150 Gram-negative sepsis, unspecified: Secondary | ICD-10-CM | POA: Diagnosis not present

## 2022-05-21 LAB — BASIC METABOLIC PANEL
Anion gap: 9 (ref 5–15)
BUN: 13 mg/dL (ref 8–23)
CO2: 23 mmol/L (ref 22–32)
Calcium: 8.2 mg/dL — ABNORMAL LOW (ref 8.9–10.3)
Chloride: 108 mmol/L (ref 98–111)
Creatinine, Ser: 0.93 mg/dL (ref 0.44–1.00)
GFR, Estimated: >60 mL/min (ref >60)
Glucose, Bld: 115 mg/dL — ABNORMAL HIGH (ref 70–99)
Potassium: 4.1 mmol/L (ref 3.5–5.1)
Sodium: 140 mmol/L (ref 135–145)

## 2022-05-21 LAB — URINE CULTURE

## 2022-05-21 LAB — CBC
HCT: 37.4 % (ref 36.0–46.0)
Hemoglobin: 11.5 g/dL — ABNORMAL LOW (ref 12.0–15.0)
MCH: 25.4 pg — ABNORMAL LOW (ref 26.0–34.0)
MCHC: 30.7 g/dL (ref 30.0–36.0)
MCV: 82.6 fL (ref 80.0–100.0)
Platelets: 488 10*3/uL — ABNORMAL HIGH (ref 150–400)
RBC: 4.53 MIL/uL (ref 3.87–5.11)
RDW: 17 % — ABNORMAL HIGH (ref 11.5–15.5)
WBC: 25.1 10*3/uL — ABNORMAL HIGH (ref 4.0–10.5)
nRBC: 0 % (ref 0.0–0.2)

## 2022-05-21 LAB — PROTIME-INR
INR: 1.1 (ref 0.8–1.2)
Prothrombin Time: 14.1 seconds (ref 11.4–15.2)

## 2022-05-21 LAB — PROCALCITONIN: Procalcitonin: <0.1 ng/mL

## 2022-05-21 LAB — CORTISOL-AM, BLOOD: Cortisol - AM: 2.1 ug/dL — ABNORMAL LOW (ref 6.7–22.6)

## 2022-05-21 LAB — HIV ANTIBODY (ROUTINE TESTING W REFLEX): HIV Screen 4th Generation wRfx: NONREACTIVE

## 2022-05-21 MED ORDER — HYDROMORPHONE HCL 1 MG/ML IJ SOLN
1.0000 mg | INTRAMUSCULAR | Status: DC | PRN
  Administered 2022-05-21 – 2022-05-23 (×6): 1 mg via INTRAVENOUS
  Filled 2022-05-21 (×6): qty 1

## 2022-05-21 MED ORDER — PIPERACILLIN-TAZOBACTAM 3.375 G IVPB
3.3750 g | Freq: Three times a day (TID) | INTRAVENOUS | Status: DC
  Administered 2022-05-21 – 2022-05-24 (×10): 3.375 g via INTRAVENOUS
  Filled 2022-05-21 (×10): qty 50

## 2022-05-21 NOTE — Evaluation (Signed)
Occupational Therapy Evaluation Patient Details Name: Bridget Williams MRN: [id number] DOB: 1956/05/29 Today's Date: 05/21/2022   History of Present Illness Pt is a 66 y.o. female who presented to the ED with worsening abdominal pain with nausea and vomiting. Imaging found abdominal hernia. Pt is s/p repair of recurrent ventral hernia with small bowel obstruction on 05/20/22. PMH: GERD, obesity, hypothyroidism and ventral hernia.   Clinical Impression   Pt was seen for OT evaluation this date. Prior to hospital admission, pt was driving, independent with mobility and ADLs. Pt lives alone in a one level house with 1 step to enter. Pt reports family is available PRN for assistance if needed. Pt presents to acute OT demonstrating impaired ADL performance and functional mobility 2/2 decreased activity tolerance and functional strength/ROM/balance deficits. Pt currently requires MAX A x2 for rolling in bed to perform pericare/linen change. Anticipate MAX A for bathing and lower body dressing bed level. Pt reporting 10/10 pain - nursing notified. Pt performance may improve with decreased pain. Pt educated on bed level LE exercises and incentive spirometer. Pt verbalized and demonstrated understanding of instruction. Pt would benefit from skilled OT to address noted impairments and functional limitations (see below for any additional details). Upon hospital discharge, recommend STR to maximize pt safety and return to PLOF.      Recommendations for follow up therapy are one component of a multi-disciplinary discharge planning process, led by the attending physician.  Recommendations may be updated based on patient status, additional functional criteria and insurance authorization.   Follow Up Recommendations  Skilled nursing-short term rehab (<3 hours/day)    Assistance Recommended at Discharge Frequent or constant Supervision/Assistance  Patient can return home with the following Two people to help with  walking and/or transfers;A lot of help with bathing/dressing/bathroom;Assistance with cooking/housework;Assist for transportation;Help with stairs or ramp for entrance    Functional Status Assessment  Patient has had a recent decline in their functional status and demonstrates the ability to make significant improvements in function in a reasonable and predictable amount of time.  Equipment Recommendations  BSC/3in1    Recommendations for Other Services       Precautions / Restrictions Precautions Precautions: Fall Restrictions Weight Bearing Restrictions: No      Mobility Bed Mobility Overal bed mobility: Needs Assistance Bed Mobility: Rolling Rolling: Max assist, +2 for physical assistance              Transfers                   General transfer comment: deferred at this time due to 10/10 pain      Balance                                           ADL either performed or assessed with clinical judgement   ADL Overall ADL's : Needs assistance/impaired                                       General ADL Comments: MAX A x2 for pericare bed level.      Pertinent Vitals/Pain Pain Assessment Pain Assessment: 0-10 Pain Score: 10-Worst pain ever Pain Location: abdominal surgical site Pain Descriptors / Indicators: Discomfort, Grimacing, Guarding, Sore Pain Intervention(s): Limited activity within patient's tolerance, Monitored during session, Repositioned  Hand Dominance     Extremity/Trunk Assessment Upper Extremity Assessment Upper Extremity Assessment: Overall WFL for tasks assessed   Lower Extremity Assessment Lower Extremity Assessment: Defer to PT evaluation       Communication Communication Communication: No difficulties   Cognition Arousal/Alertness: Awake/alert Behavior During Therapy: WFL for tasks assessed/performed Overall Cognitive Status: Within Functional Limits for tasks assessed                                                   Home Living Family/patient expects to be discharged to:: Private residence Living Arrangements: Alone Available Help at Discharge: Family;Available PRN/intermittently Type of Home: House Home Access: Stairs to enter Entrance Stairs-Number of Steps: 1 Entrance Stairs-Rails: None Home Layout: One level     Bathroom Shower/Tub: Producer, television/film/video: Standard     Home Equipment: Shower seat          Prior Functioning/Environment Prior Level of Function : Independent/Modified Independent;Driving             Mobility Comments: Independent with mobility ADLs Comments: Independent with ADLs        OT Problem List: Decreased strength;Decreased range of motion;Decreased activity tolerance;Impaired balance (sitting and/or standing)      OT Treatment/Interventions: Self-care/ADL training;Therapeutic exercise;Energy conservation;DME and/or AE instruction;Therapeutic activities;Patient/family education;Balance training    OT Goals(Current goals can be found in the care plan section) Acute Rehab OT Goals Patient Stated Goal: to improve pain OT Goal Formulation: With patient Time For Goal Achievement: 06/04/22 Potential to Achieve Goals: Good ADL Goals Pt Will Perform Grooming: sitting;with supervision Pt Will Perform Lower Body Dressing: with min assist;with adaptive equipment;sit to/from stand Pt Will Transfer to Toilet: stand pivot transfer;with min assist;bedside commode  OT Frequency: Min 2X/week       AM-PAC OT "6 Clicks" Daily Activity     Outcome Measure Help from another person eating meals?: A Little Help from another person taking care of personal grooming?: A Little Help from another person toileting, which includes using toliet, bedpan, or urinal?: A Lot Help from another person bathing (including washing, rinsing, drying)?: A Lot Help from another person to put on and taking off regular  upper body clothing?: A Lot Help from another person to put on and taking off regular lower body clothing?: A Lot 6 Click Score: 14   End of Session Equipment Utilized During Treatment: Other (comment) (none)  Activity Tolerance: Patient tolerated treatment well;Patient limited by pain Patient left: in bed;with call bell/phone within reach;with bed alarm set  OT Visit Diagnosis: Muscle weakness (generalized) (M62.81)                Time: 1610-9604 OT Time Calculation (min): 33 min Charges:  OT General Charges $OT Visit: 1 Visit OT Evaluation $OT Eval Moderate Complexity: 1 Mod OT Treatments $Self Care/Home Management : 8-22 mins $Therapeutic Exercise: 8-22 mins  [employer], OTDS  [employer] 05/21/2022, 3:06 PM

## 2022-05-21 NOTE — Progress Notes (Signed)
Patient ID: Bridget Williams, female   DOB: 1956-04-05, 66 y.o.   MRN: [id number]     SURGICAL PROGRESS NOTE   Hospital Day(s): 1.   Interval History: Patient seen and examined, no acute events or new complaints overnight. Patient reports having pain in the left side of the abdomen.  She denies any nausea or vomiting.  Vital signs in last 24 hours: [min-max] current  Temp:  [96.9 F (36.1 C)-98.7 F (37.1 C)] 98.4 F (36.9 C) (07/25 0442) Pulse Rate:  [97-116] 108 (07/25 0442) Resp:  [16-22] 17 (07/25 0442) BP: (133-151)/(60-114) 150/74 (07/25 0442) SpO2:  [90 %-98 %] 97 % (07/25 0442) Weight:  [93 kg] 93 kg (07/24 1330)     Height: 5' (152.4 cm) Weight: 93 kg BMI (Calculated): 40.04   Physical Exam:  Constitutional: alert, cooperative and no distress  Respiratory: breathing non-labored at rest  Cardiovascular: regular rate and sinus rhythm  Gastrointestinal: soft, tender in the left side of the abdomen, and non-distended.  Praveena in place  Labs:     Latest Ref Rng & Units 05/21/2022    4:41 AM 05/20/2022    1:31 PM 01/07/2016    8:43 AM  CBC  WBC 4.0 - 10.5 K/uL 25.1  21.0  10.2   Hemoglobin 12.0 - 15.0 g/dL 58.5  27.7  82.4   Hematocrit 36.0 - 46.0 % 37.4  42.2  34.1   Platelets 150 - 400 K/uL 488  519  231       Latest Ref Rng & Units 05/21/2022    4:41 AM 05/20/2022    1:31 PM 01/07/2016    8:43 AM  CMP  Glucose 70 - 99 mg/dL 235  361  443   BUN 8 - 23 mg/dL 13  12  11    Creatinine 0.44 - 1.00 mg/dL  1.54  0.08   Sodium 135 - 145 mmol/L 140  143  140   Potassium 3.5 - 5.1 mmol/L 4.1  4.3  3.9   Chloride 98 - 111 mmol/L 108  103  106   CO2 22 - 32 mmol/L 23  29  26    Calcium 8.9 - 10.3 mg/dL 8.2  9.6  8.4   Total Protein 6.5 - 8.1 g/dL  7.8    Total Bilirubin 0.3 - 1.2 mg/dL  1.1    Alkaline Phos 38 - 126 U/L  104    AST 15 - 41 U/L  24    ALT 0 - 44 U/L  24      Imaging studies: No new pertinent imaging studies   Assessment/Plan:  67 y.o. female with  recurrent ventral hernia with obstruction 1 Day Post-Op s/p ventral hernia repair, complicated by pertinent comorbidities including UTI, morbid obesity.  Recurrent ventral hernia with obstruction -S/p ventral hernia repair yesterday.  No sign of strangulation -We will continue with pain control today -We will continue with clear liquids and assess for toleration -I encouraged patient to ambulate -PT/OT evaluation and management -Continue Prevena  UTI -Present on admission -Continue antibiotic therapy as per hospitalist    , MD

## 2022-05-21 NOTE — Evaluation (Signed)
Physical Therapy Evaluation Patient Details Name: Bridget Williams MRN: [id number] DOB: [birth year] Today's Date: 05/21/2022  History of Present Illness  66 y.o. female who presented to the ED with abdominal pain with nausea and vomiting. Imaging found abdominal hernia. Pt is s/p repair of recurrent ventral hernia with small bowel obstruction on 05/20/22. PMH: GERD, obesity, hypothyroidism and ventral hernia.  Clinical Impression  Pt clearly in a lot of pain on arrival but showed good willingness to try and work with PT, ultimately she needed assist with bed mobility, rising to standing along with plenty of extra time and encouragement.  However she was able to walk ~30 ft and despite increased pain (9/10) and HR (120s) she did better than she had expected, sitting in recliner at the close of the session.      Recommendations for follow up therapy are one component of a multi-disciplinary discharge planning process, led by the attending physician.  Recommendations may be updated based on patient status, additional functional criteria and insurance authorization.  Follow Up Recommendations Skilled nursing-short term rehab (<3 hours/day)      Assistance Recommended at Discharge  PRN  Patient can return home with the following  A little help with walking and/or transfers;A little help with bathing/dressing/bathroom;Assistance with feeding    Equipment Recommendations Rolling walker (2 wheels)  Recommendations for Other Services       Functional Status Assessment Patient has had a recent decline in their functional status and demonstrates the ability to make significant improvements in function in a reasonable and predictable amount of time.     Precautions / Restrictions Precautions Precautions: Fall Restrictions Weight Bearing Restrictions: No      Mobility  Bed Mobility Overal bed mobility: Needs Assistance Bed Mobility: Rolling, Sidelying to Sit Rolling: Min guard, Min  assist Sidelying to sit: Min assist, Mod assist       General bed mobility comments: plenty of cuing, encouagement and extra time, but pt was able to roll to side and slowly elevate trunk to sitting with guarded with only minimmally assisted effort    Transfers Overall transfer level: Needs assistance Equipment used: Rolling walker (2 wheels) Transfers: Sit to/from Stand Sit to Stand: Min assist, Mod assist           General transfer comment: Pt was able to initiate upward motion but did need direct assist to insure torso continued to come forward/up, light assist to [employer]    Ambulation/Gait Ambulation/Gait assistance: Min assist Gait Distance (Feet): 30 Feet Assistive device: Rolling walker (2 wheels)         General Gait Details: Pt able to slowly and cautiously get to/from door with heavy walker reliance but no LOBs or overt safety concers.  Stairs            Wheelchair Mobility    Modified Rankin (Stroke Patients Only)       Balance Overall balance assessment: Modified Independent                                           Pertinent Vitals/Pain Pain Assessment Pain Assessment: 0-10 Pain Score: 6  Pain Location: abdominal surgical site    Home Living Family/patient expects to be discharged to:: Unsure Living Arrangements: Alone Available Help at Discharge: Family;Available PRN/intermittently Type of Home: House Home Access: Stairs to enter Entrance Stairs-Rails: None Entrance Stairs-Number of Steps: 1  Home Layout: One level Home Equipment: Shower seat      Prior Function Prior Level of Function : Independent/Modified Independent;Driving             Mobility Comments: Independent with mobility ADLs Comments: Independent with ADLs     Hand Dominance        Extremity/Trunk Assessment   Upper Extremity Assessment Upper Extremity Assessment: Overall WFL for tasks assessed;Generalized weakness    Lower  Extremity Assessment Lower Extremity Assessment: Generalized weakness;Overall [facility] for tasks assessed (pain hesitant)       Communication   Communication: No difficulties  Cognition Arousal/Alertness: Awake/alert Behavior During Therapy: WFL for tasks assessed/performed Overall Cognitive Status: Within Functional Limits for tasks assessed                                          General Comments General comments (skin integrity, edema, etc.): Pt was able to do more than she had expected, though still very pain limited.    Exercises     Assessment/Plan    PT Assessment Patient needs continued PT services  PT Problem List Decreased strength;Decreased range of motion;Decreased activity tolerance;Decreased balance;Decreased mobility;Decreased coordination;Decreased knowledge of use of DME;Decreased safety awareness;Pain;Cardiopulmonary status limiting activity       PT Treatment Interventions Gait training;DME instruction;Stair training;Functional mobility training;Therapeutic activities;Therapeutic exercise;Cognitive remediation;Neuromuscular re-education;Patient/family education    PT Goals (Current goals can be found in the Care Plan section)  Acute Rehab PT Goals Patient Stated Goal: go home PT Goal Formulation: With patient Time For Goal Achievement: 06/03/22 Potential to Achieve Goals: Fair    Frequency Min 2X/week     Co-evaluation               AM-PAC PT "6 Clicks" Mobility  Outcome Measure Help needed turning from your back to your side while in a flat bed without using bedrails?: A Little Help needed moving from lying on your back to sitting on the side of a flat bed without using bedrails?: A Lot Help needed moving to and from a bed to a chair (including a wheelchair)?: A Lot Help needed standing up from a chair using your arms (e.g., wheelchair or bedside chair)?: A Lot Help needed to walk in hospital room?: A Lot Help needed climbing 3-5  steps with a railing? : A Lot 6 Click Score: 13    End of Session Equipment Utilized During Treatment: Gait belt Activity Tolerance: Patient tolerated treatment well Patient left: with chair alarm set;with call bell/phone within reach Nurse Communication: Mobility status PT Visit Diagnosis: Muscle weakness (generalized) (M62.81);Difficulty in walking, not elsewhere classified (R26.2);Pain Pain - part of body:  (abdominal)    Time: 4081-4481 PT Time Calculation (min) (ACUTE ONLY): 27 min   Charges:   PT Evaluation $PT Eval Low Complexity: 1 Low PT Treatments $Gait Training: 8-22 mins        Malachi Pro, DPT 05/21/2022, 3:51 PM

## 2022-05-21 NOTE — Progress Notes (Signed)
   05/21/22 2110  Assess: MEWS Score  BP 121/79  MAP (mmHg) 91  Pulse Rate (!) 111  SpO2 94 %  O2 Device Nasal Cannula  O2 Flow Rate (L/min) 2 L/min  Assess: MEWS Score  MEWS Temp 0  MEWS Systolic 0  MEWS Pulse 2  MEWS RR 0  MEWS LOC 0  MEWS Score 2  MEWS Score Color Yellow  Assess: if the MEWS score is Yellow or Red  Were vital signs taken at a resting state? Yes  Focused Assessment No change from prior assessment  Does the patient meet 2 or more of the SIRS criteria? No  MEWS guidelines implemented *See Row Information* No, vital signs rechecked  Treat  MEWS Interventions Administered prn meds/treatments  Pain Scale 0-10  Pain Score 5  Pain Intervention(s) Medication (See eMAR)  Notify: Charge Nurse/RN  Name of Charge Nurse/RN Notified Tam  Date Charge Nurse/RN Notified 05/21/22  Time Charge Nurse/RN Notified 2110  Assess: SIRS CRITERIA  SIRS Temperature  0  SIRS Pulse 1  SIRS Respirations  0  SIRS WBC 1  SIRS Score Sum  2

## 2022-05-21 NOTE — Progress Notes (Signed)
PROGRESS NOTE    Bridget Williams  OVF:[id number] DOB: June 24, 1956 DOA: 05/20/2022 PCP: Mickey Farber, MD    Brief Narrative:  Bridget Williams is a 66 y.o. female with medical history significant for GERD, obesity, hypothyroidism and ventral hernia, who presented to the ER with acute onset of worsening abdominal pain over the last couple of days with associated intractable nausea and vomiting.  She had a bowel movement with diarrhea today.  She denies any fever or chills.  No chest pain or dyspnea or cough or wheezing.  She admitted to dysuria without urinary frequency or urgency or hematuria or flank pain.  No headache or dizziness or blurred vision.  No melena or bright red bleeding per rectum.  She denies any bilious vomitus or hematemesis.  The patient was seen in the PACU postoperatively.   ED Course: When she came to the ER, BP was 150/114 with otherwise normal vital signs.  Labs revealed unremarkable CMP.  CBC showed leukocytosis of 21 with neutrophilia.  UA was positive for UTI.  Urine culture was sent.    Consultants:  surgery  Procedures:   Antimicrobials:  ceftriaxone   Subjective: Had abd pain where wound vac is.  Objective: Vitals:   05/20/22 2218 05/21/22 0152 05/21/22 0442 05/21/22 0832  BP: 134/60 135/73 (!) 150/74 135/64  Pulse: (!) 110 (!) 108 (!) 108 (!) 108  Resp: 18 18 17 20   Temp: 98.2 F (36.8 C) 98.2 F (36.8 C) 98.4 F (36.9 C) 98.2 F (36.8 C)  TempSrc: Oral Oral  Oral  SpO2: 94% 96% 97% 95%  Weight:      Height:        Intake/Output Summary (Last 24 hours) at 05/21/2022 0834 Last data filed at 05/20/2022 2145 Gross per 24 hour  Intake 860 ml  Output 165 ml  Net 695 ml   Filed Weights   05/20/22 1330  Weight: 93 kg    Examination:  Calm, NAD Cta no w/r Reg s1/s2 no gallop Soft distended. +wound vac in place No edema Aaoxox3  Mood and affect appropriate in current setting     Data Reviewed: I have personally reviewed following  labs and imaging studies  CBC: Recent Labs  Lab 05/20/22 1331 05/21/22 0441  WBC 21.0* 25.1*  NEUTROABS 17.5*  --   HGB 12.9 11.5*  HCT 42.2 37.4  MCV 81.3 82.6  PLT 519* 488*   Basic Metabolic Panel: Recent Labs  Lab 05/20/22 1331 05/21/22 0441  NA 143 140  K 4.3 4.1  CL 103 108  CO2 29 23  GLUCOSE 111* 115*  BUN 12 13  CREATININE 1.10* 0.93  CALCIUM 9.6 8.2*   GFR: Estimated Creatinine Clearance: 62.2 mL/min (by C-G formula based on SCr of 0.93 mg/dL). Liver Function Tests: Recent Labs  Lab 05/20/22 1331  AST 24  ALT 24  ALKPHOS 104  BILITOT 1.1  PROT 7.8  ALBUMIN 3.5   Recent Labs  Lab 05/20/22 1331  LIPASE 20   No results for input(s): "AMMONIA" in the last 168 hours. Coagulation Profile: Recent Labs  Lab 05/21/22 0441  INR 1.1   Cardiac Enzymes: No results for input(s): "CKTOTAL", "CKMB", "CKMBINDEX", "TROPONINI" in the last 168 hours. BNP (last 3 results) No results for input(s): "PROBNP" in the last 8760 hours. HbA1C: No results for input(s): "HGBA1C" in the last 72 hours. CBG: No results for input(s): "GLUCAP" in the last 168 hours. Lipid Profile: No results for input(s): "CHOL", "HDL", "LDLCALC", "TRIG", "CHOLHDL", "  LDLDIRECT" in the last 72 hours. Thyroid Function Tests: No results for input(s): "TSH", "T4TOTAL", "FREET4", "T3FREE", "THYROIDAB" in the last 72 hours. Anemia Panel: No results for input(s): "VITAMINB12", "FOLATE", "FERRITIN", "TIBC", "IRON", "RETICCTPCT" in the last 72 hours. Sepsis Labs: Recent Labs  Lab 05/21/22 0441  PROCALCITON <0.10    No results found for this or any previous visit (from the past 240 hour(s)).       Radiology Studies: CT ABDOMEN PELVIS WO CONTRAST  Result Date: 05/20/2022 CLINICAL DATA:  Acute nonlocalized abdominal pain. Left-sided abdominal pain since last week. Diarrhea. EXAM: CT ABDOMEN AND PELVIS WITHOUT CONTRAST TECHNIQUE: Multidetector CT imaging of the abdomen and pelvis was  performed following the standard protocol without IV contrast. RADIATION DOSE REDUCTION: This exam was performed according to the departmental dose-optimization program which includes automated exposure control, adjustment of the mA and/or kV according to patient size and/or use of iterative reconstruction technique. COMPARISON:  01/05/2016 FINDINGS: Lower chest: Lung bases are clear. Large esophageal hiatal hernia behind the heart. Moderately prominent retrocrural lymph nodes, nonspecific but likely reactive. Hepatobiliary: No focal liver lesions. Cholelithiasis with multiple stones in the gallbladder. No gallbladder wall thickening. Bile ducts are not dilated. Pancreas: Unremarkable. No pancreatic ductal dilatation or surrounding inflammatory changes. Spleen: Normal in size without focal abnormality. Adrenals/Urinary Tract: No adrenal gland nodules. Multiple stones in the right kidney. Largest measuring about 3 mm in diameter. No hydronephrosis or hydroureter. No ureteral stones are seen. Left kidney, left ureter, and the bladder are unremarkable. Stomach/Bowel: Large esophageal hiatal hernia. Stomach is decompressed. There is a large broad-based ventral abdominal wall hernia containing multiple loops of small bowel and transverse colon. Proximal small bowel are mildly dilated with evidence of diffuse wall thickening. There is however a transition zone within the hernia with decompressed bowel distally. Prominent stranding in the mesenteric fat and in the hernia. Colon is decompressed. Scattered colonic diverticula without evidence of acute diverticulitis. The appendix is normal. Vascular/Lymphatic: No significant vascular findings are present. No enlarged abdominal or pelvic lymph nodes. Reproductive: Status post hysterectomy. No adnexal masses. Other: No free air or free fluid in the abdomen. Musculoskeletal: No acute or significant osseous findings. IMPRESSION: 1. Large broad-based ventral abdominal wall hernia  containing small bowel and transverse colon. Mildly dilated small bowel entering the hernia with decompressed small bowel exiting the hernia consistent with obstruction. Mesenteric stranding and small bowel wall thickening may indicate changes of edema or ischemia and suggest incarceration. No pneumatosis. 2. Nonobstructing intrarenal stones in the right kidney. 3. Cholelithiasis without evidence of acute cholecystitis. 4. Large esophageal hiatal hernia. Electronically Signed   By: Burman Nieves M.D.   On: 05/20/2022 17:23        Scheduled Meds:  aspirin EC  81 mg Oral Daily   buPROPion  150 mg Oral BID   enoxaparin (LOVENOX) injection  0.5 mg/kg Subcutaneous Q24H   levothyroxine  150 mcg Oral QAC breakfast   multivitamin with minerals  1 tablet Oral Daily   pantoprazole  40 mg Oral Daily   Continuous Infusions:  cefTRIAXone (ROCEPHIN)  IV      Assessment & Plan:   Principal Problem:   Sepsis due to gram-negative UTI (HCC) Active Problems:   Incarcerated ventral hernia   Hypothyroidism   Essential hypertension   Depression   GERD without esophagitis   Sepsis  Likely due to incarcerated ventral hernia and UTI. Change rocephin to zosyn to cover intra-abd infection Wbc up.  S/p ventral hernia repair on  05/21/22 . No sign of strangulation per surgery S/p wound vac prevena. Pain control   Incarcerated ventral hernia S/p repair Continue iv abx as above Pain control Wound vac as above Ambulate as tolerated   Hypothyroidism Continue Synthroid   GERD without esophagitis Continue PPI   Depression Continue Wellbutrin     Essential hypertension Stable continue IV BP meds  DVT prophylaxis: Lovenox Code Status: Full Family Communication: None at bedside Disposition Plan:  Status is: Inpatient Remains inpatient appropriate because: IV treatment        LOS: 1 day   Time spent: 35 minutes    Lynn Ito, MD [facility] Pager [phone]  If  7PM-7AM, please contact night-coverage 05/21/2022, 8:34 AM

## 2022-05-22 DIAGNOSIS — N390 Urinary tract infection, site not specified: Secondary | ICD-10-CM | POA: Diagnosis not present

## 2022-05-22 DIAGNOSIS — A4150 Gram-negative sepsis, unspecified: Secondary | ICD-10-CM | POA: Diagnosis not present

## 2022-05-22 LAB — CBC
HCT: 37.4 % (ref 36.0–46.0)
Hemoglobin: 11.2 g/dL — ABNORMAL LOW (ref 12.0–15.0)
MCH: 25.1 pg — ABNORMAL LOW (ref 26.0–34.0)
MCHC: 29.9 g/dL — ABNORMAL LOW (ref 30.0–36.0)
MCV: 83.7 fL (ref 80.0–100.0)
Platelets: 468 10*3/uL — ABNORMAL HIGH (ref 150–400)
RBC: 4.47 MIL/uL (ref 3.87–5.11)
RDW: 17 % — ABNORMAL HIGH (ref 11.5–15.5)
WBC: 20.5 10*3/uL — ABNORMAL HIGH (ref 4.0–10.5)
nRBC: 0 % (ref 0.0–0.2)

## 2022-05-22 LAB — SURGICAL PATHOLOGY

## 2022-05-22 MED ORDER — SODIUM CHLORIDE 0.9 % IV SOLN: INTRAVENOUS | Status: DC

## 2022-05-22 MED ORDER — POLYETHYLENE GLYCOL 3350 17 G PO PACK
17.0000 g | PACK | Freq: Two times a day (BID) | ORAL | Status: DC
  Administered 2022-05-22 – 2022-05-24 (×5): 17 g via ORAL
  Filled 2022-05-22 (×5): qty 1

## 2022-05-22 NOTE — Progress Notes (Signed)
Mobility Specialist - Progress Note   05/22/22 1551  Mobility  Activity Transferred to/from [facility]  Level of Assistance Standby assist, set-up cues, supervision of patient - no hands on  Assistive Device BSC  Distance Ambulated (ft) 2 ft  Activity Response Tolerated well  $Mobility charge 1 Mobility    Post-mobility: 121 HR, 92% SPO2   Pt transferred to [facility] with supervision for hopeful BM. Unsuccessful upon BM, urinal out only. Assist for peri-care d/t BUE support. Fatigued with activity. Pt left in bed with alarm set, needs in reach.    Filiberto Pinks Mobility Specialist 05/22/22, 3:53 PM

## 2022-05-22 NOTE — TOC Initial Note (Signed)
Transition of Care [facility]) - Initial/Assessment Note    Patient Details  Name: Bridget Williams MRN: [id number] Date of Birth: Mar 10, 1956  [facility] CM/SW Contact:    Chapman Fitch, RN Phone Number: 05/22/2022, 3:59 PM  Clinical Narrative:                    Admitted for: 2 Day Post-Op s/p ventral hernia repair, complicated by pertinent comorbidities including UTI Admitted from: Home alone.  States she has local family support  PCP: Lenon Oms NP  Current home health/prior home health/DME: Built in shower bench   Therapy recommending SNF.  Patient declines SNF or home health.  Patient agreeable to RW.  Referral made to [facility] with [employer]  Patient states her sister will pick her up at discharge       Patient Goals and CMS Choice        Expected Discharge Plan and Services                                                Prior Living Arrangements/Services                       Activities of Daily Living      Permission Sought/Granted                  Emotional Assessment              Admission diagnosis:  Sepsis due to gram-negative UTI (HCC) [A41.50, N39.0] Patient Active Problem List   Diagnosis Date Noted   Sepsis due to gram-negative UTI (HCC) 05/20/2022   Hypothyroidism 05/20/2022   Essential hypertension 05/20/2022   Depression 05/20/2022   GERD without esophagitis 05/20/2022   Incarcerated ventral hernia 01/05/2016   Large bowel obstruction (HCC)    Incisional ventral hernia w obstruction    Morbid obesity (HCC)    Body mass index (BMI) of 40.0-44.9 in adult (HCC) 12/07/2015   Clinical depression 12/01/2014   Avitaminosis D 12/01/2014   Arteriosclerosis of coronary artery 10/12/2014   Chest pain 10/12/2014   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 [place] [address] Phone: [phone] Fax: [phone]     Social Determinants of Health (SDOH) Interventions    Readmission Risk Interventions     No data to display

## 2022-05-22 NOTE — Progress Notes (Signed)
Occupational Therapy Treatment Patient Details Name: Bridget Williams MRN: [id number] DOB: 11-15-1955 Today's Date: 05/22/2022   History of present illness 66 y.o. female who presented to the ED with abdominal pain with nausea and vomiting. Imaging found abdominal hernia. Pt is s/p repair of recurrent ventral hernia with small bowel obstruction on 05/20/22. PMH: GERD, obesity, hypothyroidism and ventral hernia.   OT comments  Pt seen for OT treatment on this date. Upon arrival to room pt awake and alert, lying with HOB elevated in bed and family present. Pt reporting 8/10 pain. Pt agreeable to tx focused on bed mobility and standing grooming tasks. Pt instructed on log rolling to manage abdominal pain. Pt required MIN A for rolling to L side and MOD A for sidelying>sit - needs assistance with with trunk, with mod vcs for sequencing and safe hand placement. MAX A for donning/doffing socks while seated EOB. CGA + RW for STS. SBA for grooming tasks at the sink ~8 minutes - with no UE support. Pt oxygen was 92% on room air in standing at the end of activity. Pt and family educated on equipment and assistance needed for safe return home. Pt and family verbalized understanding and return demonstrated of instruction provided. Pt left in bed with all needs met and family present. Pt making good progress toward goals. Pt continues to benefit from skilled OT services to maximize return to PLOF and minimize risk of future falls, injury, caregiver burden, and readmission. Will continue to follow POC. Discharge recommendation remains appropriate. If pt returns home, pt would need bed rails and assistance with bed mobility.     Recommendations for follow up therapy are one component of a multi-disciplinary discharge planning process, led by the attending physician.  Recommendations may be updated based on patient status, additional functional criteria and insurance authorization.    Follow Up Recommendations  Skilled  nursing-short term rehab (<3 hours/day)    Assistance Recommended at Discharge Frequent or constant Supervision/Assistance  Patient can return home with the following  A little help with walking and/or transfers;A lot of help with bathing/dressing/bathroom;Assistance with cooking/housework;Assist for transportation;Help with stairs or ramp for entrance   Equipment Recommendations  BSC/3in1;Other (comment) (bed rails)    Recommendations for Other Services      Precautions / Restrictions Precautions Precautions: Fall Restrictions Weight Bearing Restrictions: No       Mobility Bed Mobility Overal bed mobility: Needs Assistance Bed Mobility: Rolling, Sidelying to Sit Rolling: Min assist Sidelying to sit: Mod assist            Transfers Overall transfer level: Needs assistance Equipment used: Rolling walker (2 wheels) Transfers: Sit to/from Stand Sit to Stand: Min guard                 Balance Overall balance assessment: Modified Independent                                         ADL either performed or assessed with clinical judgement   ADL Overall ADL's : Needs assistance/impaired                                       General ADL Comments: MAX A for donning/doffing socks while seated EOB. SBA for grooming tasks at the sink ~8 minutes - with no  UE support.      Cognition Arousal/Alertness: Awake/alert Behavior During Therapy: WFL for tasks assessed/performed Overall Cognitive Status: Within Functional Limits for tasks assessed                                                     Pertinent Vitals/ Pain       Pain Assessment Pain Assessment: 0-10 Pain Score: 8  Pain Location: abdominal surgical site Pain Descriptors / Indicators: Discomfort, Grimacing, Guarding, Sore Pain Intervention(s): Limited activity within patient's tolerance, Monitored during session, Repositioned   Frequency  Min 2X/week         Progress Toward Goals  OT Goals(current goals can now be found in the care plan section)  Progress towards OT goals: Progressing toward goals  Acute Rehab OT Goals Patient Stated Goal: to improve pain OT Goal Formulation: With patient Time For Goal Achievement: 06/04/22 Potential to Achieve Goals: Good ADL Goals Pt Will Perform Grooming: sitting;with supervision Pt Will Perform Lower Body Dressing: with min assist;with adaptive equipment;sit to/from stand Pt Will Transfer to Toilet: stand pivot transfer;with min assist;bedside commode  Plan Discharge plan remains appropriate;Frequency remains appropriate       AM-PAC OT "6 Clicks" Daily Activity     Outcome Measure   Help from another person eating meals?: A Little Help from another person taking care of personal grooming?: A Little Help from another person toileting, which includes using toliet, bedpan, or urinal?: A Lot Help from another person bathing (including washing, rinsing, drying)?: A Lot Help from another person to put on and taking off regular upper body clothing?: A Little Help from another person to put on and taking off regular lower body clothing?: A Lot 6 Click Score: 15    End of Session Equipment Utilized During Treatment: Rolling walker (2 wheels)  OT Visit Diagnosis: Muscle weakness (generalized) (M62.81)   Activity Tolerance Patient tolerated treatment well;Patient limited by pain   Patient Left in bed;with call bell/phone within reach;with family/visitor present   Nurse Communication          Time: 5749-3552 OT Time Calculation (min): 26 min  Charges: OT General Charges $OT Visit: 1 Visit OT Treatments $Self Care/Home Management : 23-37 mins  [employer], OTDS  [employer] 05/22/2022, 1:29 PM

## 2022-05-22 NOTE — Progress Notes (Signed)
Patient ID: Bridget Williams, female   DOB: [birth year], 66 y.o.   MRN: [id number]     SURGICAL PROGRESS NOTE   Hospital Day(s): 2.   Interval History: Patient seen and examined, no acute events or new complaints overnight. Patient reports feeling better this morning.  Pain getting under better control.  Still with significant pain on the left side of the abdomen.  Still not passing gas.  Denies any nausea or vomiting.  Vital signs in last 24 hours: [min-max] current  Temp:  [97.5 F (36.4 C)-99.1 F (37.3 C)] 99.1 F (37.3 C) (07/26 0835) Pulse Rate:  [100-111] 110 (07/26 0835) Resp:  [18-20] 20 (07/26 0835) BP: (121-135)/(60-79) 135/75 (07/26 0835) SpO2:  [94 %-98 %] 98 % (07/26 0835)     Height: 5' (152.4 cm) Weight: 93 kg BMI (Calculated): 40.04   Physical Exam:  Constitutional: alert, cooperative and no distress  Respiratory: breathing non-labored at rest  Cardiovascular: regular rate and sinus rhythm  Gastrointestinal: soft, non-tender, and non-distended.  Praveena in place.  Labs:     Latest Ref Rng & Units 05/22/2022    8:46 AM 05/21/2022    4:41 AM 05/20/2022    1:31 PM  CBC  WBC 4.0 - 10.5 K/uL 20.5  25.1  21.0   Hemoglobin 12.0 - 15.0 g/dL 83.3  82.5  05.3   Hematocrit 36.0 - 46.0 % 37.4  37.4  42.2   Platelets 150 - 400 K/uL 468  488  519       Latest Ref Rng & Units 05/21/2022    4:41 AM 05/20/2022    1:31 PM 01/07/2016    8:43 AM  CMP  Glucose 70 - 99 mg/dL 976  734  193   BUN 8 - 23 mg/dL 13  12  11    Creatinine 0.44 - 1.00 mg/dL  7.90  2.40   Sodium 135 - 145 mmol/L 140  143  140   Potassium 3.5 - 5.1 mmol/L 4.1  4.3  3.9   Chloride 98 - 111 mmol/L 108  103  106   CO2 22 - 32 mmol/L 23  29  26    Calcium 8.9 - 10.3 mg/dL 8.2  9.6  8.4   Total Protein 6.5 - 8.1 g/dL  7.8    Total Bilirubin 0.3 - 1.2 mg/dL  1.1    Alkaline Phos 38 - 126 U/L  104    AST 15 - 41 U/L  24    ALT 0 - 44 U/L  24      Imaging studies: No new pertinent imaging  studies   Assessment/Plan:  66 y.o. female with recurrent ventral hernia with obstruction 2 Day Post-Op s/p ventral hernia repair, complicated by pertinent comorbidities including UTI, morbid obesity.   Recurrent ventral hernia with obstruction -S/p ventral hernia repair yesterday.  No sign of strangulation -Continue pain management -Advance diet to soft diet -I will order MiraLAX for constipation -I encouraged patient to ambulate -PT/OT evaluation and management -Continue Prevena -If she tolerates diet and pain continues to get improve may consider discharge in 24 to 48 hours.   UTI -Present on admission -Continue antibiotic therapy as per hospitalist  , MD

## 2022-05-22 NOTE — Progress Notes (Signed)
PROGRESS NOTE    Bridget Williams  EHM:[id number] DOB: [birth year] DOA: 05/20/2022 PCP: Mickey Farber, MD    Brief Narrative:  Bridget Williams is a 66 y.o. female with medical history significant for GERD, obesity, hypothyroidism and ventral hernia, who presented to the ER with acute onset of worsening abdominal pain over the last couple of days with associated intractable nausea and vomiting.  She had a bowel movement with diarrhea today.  She denies any fever or chills.  No chest pain or dyspnea or cough or wheezing.  She admitted to dysuria without urinary frequency or urgency or hematuria or flank pain.  No headache or dizziness or blurred vision.  No melena or bright red bleeding per rectum.  She denies any bilious vomitus or hematemesis.  The patient was seen in the PACU postoperatively.   ED Course: When she came to the ER, BP was 150/114 with otherwise normal vital signs.  Labs revealed unremarkable CMP.  CBC showed leukocytosis of 21 with neutrophilia.  UA was positive for UTI.  Urine culture was sent.  7/26 pain tolerable  Consultants:  surgery  Procedures:   Antimicrobials:  ceftriaxone   Subjective: No nausea or vomiting.  No flatus or bowel movement.  Objective: Vitals:   05/21/22 1528 05/21/22 2110 05/21/22 2110 05/22/22 0357  BP: 130/60 121/79  125/74  Pulse: 100 (!) 111 (!) 110 (!) 110  Resp: 18   18  Temp: 98.6 F (37 C)   (!) 97.5 F (36.4 C)  TempSrc: Oral     SpO2: 96% 94% 95% 96%  Weight:      Height:        Intake/Output Summary (Last 24 hours) at 05/22/2022 0828 Last data filed at 05/22/2022 0357 Gross per 24 hour  Intake 1060 ml  Output 450 ml  Net 610 ml   Filed Weights   05/20/22 1330  Weight: 93 kg    Examination: Calm, NAD Cta no w/r Reg s1/s2 no gallop Distended, vac in place. Hypoactive bm No edema Aaoxox3  Mood and affect appropriate in current setting     Data Reviewed: I have personally reviewed following labs and imaging  studies  CBC: Recent Labs  Lab 05/20/22 1331 05/21/22 0441  WBC 21.0* 25.1*  NEUTROABS 17.5*  --   HGB 12.9 11.5*  HCT 42.2 37.4  MCV 81.3 82.6  PLT 519* 488*   Basic Metabolic Panel: Recent Labs  Lab 05/20/22 1331 05/21/22 0441  NA 143 140  K 4.3 4.1  CL 103 108  CO2 29 23  GLUCOSE 111* 115*  BUN 12 13  CREATININE 1.10* 0.93  CALCIUM 9.6 8.2*   GFR: Estimated Creatinine Clearance: 62.2 mL/min (by C-G formula based on SCr of 0.93 mg/dL). Liver Function Tests: Recent Labs  Lab 05/20/22 1331  AST 24  ALT 24  ALKPHOS 104  BILITOT 1.1  PROT 7.8  ALBUMIN 3.5   Recent Labs  Lab 05/20/22 1331  LIPASE 20   No results for input(s): "AMMONIA" in the last 168 hours. Coagulation Profile: Recent Labs  Lab 05/21/22 0441  INR 1.1   Cardiac Enzymes: No results for input(s): "CKTOTAL", "CKMB", "CKMBINDEX", "TROPONINI" in the last 168 hours. BNP (last 3 results) No results for input(s): "PROBNP" in the last 8760 hours. HbA1C: No results for input(s): "HGBA1C" in the last 72 hours. CBG: No results for input(s): "GLUCAP" in the last 168 hours. Lipid Profile: No results for input(s): "CHOL", "HDL", "LDLCALC", "TRIG", "CHOLHDL", "LDLDIRECT" in the  last 72 hours. Thyroid Function Tests: No results for input(s): "TSH", "T4TOTAL", "FREET4", "T3FREE", "THYROIDAB" in the last 72 hours. Anemia Panel: No results for input(s): "VITAMINB12", "FOLATE", "FERRITIN", "TIBC", "IRON", "RETICCTPCT" in the last 72 hours. Sepsis Labs: Recent Labs  Lab 05/21/22 0441  PROCALCITON <0.10    Recent Results (from the past 240 hour(s))  Urine Culture     Status: Abnormal   Collection Time: 05/20/22  1:31 PM   Specimen: Urine, Clean Catch  Result Value Ref Range Status   Specimen Description   Final    URINE, CLEAN CATCH Performed at [facility], [address] [place], [place] [address]    Special Requests   Final    NONE Performed at [facility], [address] [place], [place] [address]    Culture MULTIPLE SPECIES PRESENT, SUGGEST RECOLLECTION (A)  Final   Report Status 05/21/2022 FINAL  Final         Radiology Studies: CT ABDOMEN PELVIS WO CONTRAST  Result Date: 05/20/2022 CLINICAL DATA:  Acute nonlocalized abdominal pain. Left-sided abdominal pain since last week. Diarrhea. EXAM: CT ABDOMEN AND PELVIS WITHOUT CONTRAST TECHNIQUE: Multidetector CT imaging of the abdomen and pelvis was performed following the standard protocol without IV contrast. RADIATION DOSE REDUCTION: This exam was performed according to the departmental dose-optimization program which includes automated exposure control, adjustment of the mA and/or kV according to patient size and/or use of iterative reconstruction technique. COMPARISON:  01/05/2016 FINDINGS: Lower chest: Lung bases are clear. Large esophageal hiatal hernia behind the heart. Moderately prominent retrocrural lymph nodes, nonspecific but likely reactive. Hepatobiliary: No focal liver lesions. Cholelithiasis with multiple stones in the gallbladder. No gallbladder wall thickening. Bile ducts are not dilated. Pancreas: Unremarkable. No pancreatic ductal dilatation or surrounding inflammatory changes. Spleen: Normal in size without focal abnormality. Adrenals/Urinary Tract: No adrenal gland nodules. Multiple stones in the right kidney. Largest measuring about 3 mm in diameter. No hydronephrosis or hydroureter. No ureteral stones are seen. Left kidney, left ureter, and the bladder are unremarkable. Stomach/Bowel: Large esophageal hiatal hernia. Stomach is decompressed. There is a large broad-based ventral abdominal wall hernia containing multiple loops of small bowel and transverse colon. Proximal small bowel are mildly dilated with evidence of diffuse wall thickening. There is however a transition zone within the hernia with decompressed bowel distally. Prominent stranding in the mesenteric fat and in the hernia.  Colon is decompressed. Scattered colonic diverticula without evidence of acute diverticulitis. The appendix is normal. Vascular/Lymphatic: No significant vascular findings are present. No enlarged abdominal or pelvic lymph nodes. Reproductive: Status post hysterectomy. No adnexal masses. Other: No free air or free fluid in the abdomen. Musculoskeletal: No acute or significant osseous findings. IMPRESSION: 1. Large broad-based ventral abdominal wall hernia containing small bowel and transverse colon. Mildly dilated small bowel entering the hernia with decompressed small bowel exiting the hernia consistent with obstruction. Mesenteric stranding and small bowel wall thickening may indicate changes of edema or ischemia and suggest incarceration. No pneumatosis. 2. Nonobstructing intrarenal stones in the right kidney. 3. Cholelithiasis without evidence of acute cholecystitis. 4. Large esophageal hiatal hernia. Electronically Signed   By: Burman Nieves M.D.   On: 05/20/2022 17:23        Scheduled Meds:  aspirin EC  81 mg Oral Daily   buPROPion  150 mg Oral BID   enoxaparin (LOVENOX) injection  0.5 mg/kg Subcutaneous Q24H   levothyroxine  150 mcg Oral QAC breakfast   multivitamin with minerals  1  tablet Oral Daily   pantoprazole  40 mg Oral Daily   Continuous Infusions:  piperacillin-tazobactam (ZOSYN)  IV 3.375 g (05/22/22 0824)    Assessment & Plan:   Principal Problem:   Sepsis due to gram-negative UTI (HCC) Active Problems:   Incarcerated ventral hernia   Hypothyroidism   Essential hypertension   Depression   GERD without esophagitis   Sepsis  Likely due to incarcerated ventral hernia and UTI. Change rocephin to zosyn to cover intra-abd infection S/p ventral hernia repair on 05/21/22 . No sign of strangulation per surgery S/p wound vac prevena. 7/26 WBC trending down  PT OT Start NS ivf for hydration   Incarcerated ventral hernia S/p repair Continue iv abx as above Pain  control Wound vac as above 7/26Ambulate as tolerated MiraLAX for constipation If tolerates diet and pain continues to get improvement may consider discharge in 24 to 48 hours per surgery   Hypothyroidism Continue Synthroid   GERD without esophagitis Continue PPI   Depression Continue Wellbutrin     Essential hypertension Stable continue IV BP meds  DVT prophylaxis: Lovenox Code Status: Full Family Communication: None at bedside Disposition Plan:  Status is: Inpatient Remains inpatient appropriate because: IV treatment        LOS: 2 days   Time spent: 35 minutes    Lynn Ito, MD [facility] Pager [phone]  If 7PM-7AM, please contact night-coverage 05/22/2022, 8:28 AM

## 2022-05-22 NOTE — Progress Notes (Signed)
Physical Therapy Treatment Patient Details Name: Bridget Williams MRN: [id number] DOB: [birth year] Today's Date: 05/22/2022   History of Present Illness 66 y.o. female who presented to the ED with abdominal pain with nausea and vomiting. Imaging found abdominal hernia. Pt is s/p repair of recurrent ventral hernia with small bowel obstruction on 05/20/22. PMH: GERD, obesity, hypothyroidism and ventral hernia.    PT Comments    Pt did well with PT session, showed good effort with mobility in bed, good ability to get to and maintain standing and was able to ambulate ~70 ft with the walker.  HR to the 130s, O2 high 80s with the effort, some c/o fatigue but feeling much better than yesterday.  Still recommending rehab, however per improved activity tolerance and improvement there is a realistic chance at going home with HHPT and assist from family; which is her preferred d/c plan.    Recommendations for follow up therapy are one component of a multi-disciplinary discharge planning process, led by the attending physician.  Recommendations may be updated based on patient status, additional functional criteria and insurance authorization.  Follow Up Recommendations  Skilled nursing-short term rehab (<3 hours/day) Can patient physically be transported by private vehicle: Yes   Assistance Recommended at Discharge Frequent or constant Supervision/Assistance  Patient can return home with the following A little help with walking and/or transfers;A little help with bathing/dressing/bathroom;Assistance with cooking/housework;Assist for transportation;Help with stairs or ramp for entrance   Equipment Recommendations  Rolling walker (2 wheels)    Recommendations for Other Services       Precautions / Restrictions Precautions Precautions: Fall Restrictions Weight Bearing Restrictions: No     Mobility  Bed Mobility Overal bed mobility: Needs Assistance Bed Mobility: Rolling, Sidelying to Sit, Sit to  Supine Rolling: Min assist Sidelying to sit: Min assist   Sit to supine: Min assist   General bed mobility comments: plenty of cuing, encouagement and extra time, but pt was able to roll to side and slowly elevate trunk to sitting with guarded with only minimmally assisted effort, smiliar getting back into bed    Transfers Overall transfer level: Needs assistance Equipment used: Rolling walker (2 wheels) Transfers: Sit to/from Stand Sit to Stand: Min guard           General transfer comment: Able to rise and mantain balance with walker but no assist    Ambulation/Gait Ambulation/Gait assistance: Min guard Gait Distance (Feet): 75 Feet Assistive device: Rolling walker (2 wheels)         General Gait Details: Pt less guarded and with better speed today, though still far from her baseline.  HR ~115 at begining of effort, 130s after; O2 on room air remains high 80s to low 90s.   Stairs             Wheelchair Mobility    Modified Rankin (Stroke Patients Only)       Balance Overall balance assessment: Modified Independent                                          Cognition Arousal/Alertness: Awake/alert Behavior During Therapy: WFL for tasks assessed/performed Overall Cognitive Status: Within Functional Limits for tasks assessed  Exercises      General Comments General comments (skin integrity, edema, etc.): Pt making good gains, hopes to improve enough to go home but still needing rehab      Pertinent Vitals/Pain Pain Assessment Pain Score: 5  Pain Location: abdominal surgical site    Home Living                          Prior Function            PT Goals (current goals can now be found in the care plan section) Progress towards PT goals: Progressing toward goals    Frequency    Min 2X/week      PT Plan Current plan remains appropriate     Co-evaluation              AM-PAC PT "6 Clicks" Mobility   Outcome Measure  Help needed turning from your back to your side while in a flat bed without using bedrails?: A Little Help needed moving from lying on your back to sitting on the side of a flat bed without using bedrails?: A Little Help needed moving to and from a bed to a chair (including a wheelchair)?: A Little Help needed standing up from a chair using your arms (e.g., wheelchair or bedside chair)?: A Little Help needed to walk in hospital room?: A Little Help needed climbing 3-5 steps with a railing? : A Lot 6 Click Score: 17    End of Session Equipment Utilized During Treatment: Gait belt Activity Tolerance: Patient tolerated treatment well Patient left: with bed alarm set;with call bell/phone within reach;with family/visitor present Nurse Communication: Mobility status PT Visit Diagnosis: Muscle weakness (generalized) (M62.81);Difficulty in walking, not elsewhere classified (R26.2);Pain     Time: 1700-1725 PT Time Calculation (min) (ACUTE ONLY): 25 min  Charges:  $Gait Training: 8-22 mins $Therapeutic Activity: 8-22 mins                     Malachi Pro, DPT 05/22/2022, 6:19 PM

## 2022-05-22 NOTE — Anesthesia Postprocedure Evaluation (Signed)
Anesthesia Post Note  Patient: Bridget Williams  Procedure(s) Performed: INCARCERATED VENTRAL HERNIA REPAIR (Abdomen)  Patient location during evaluation: PACU Anesthesia Type: General Level of consciousness: awake and alert Pain management: pain level controlled Vital Signs Assessment: post-procedure vital signs reviewed and stable Respiratory status: spontaneous breathing, nonlabored ventilation, respiratory function stable and patient connected to nasal cannula oxygen Cardiovascular status: blood pressure returned to baseline and stable Postop Assessment: no apparent nausea or vomiting Anesthetic complications: no   No notable events documented.   Last Vitals:  Vitals:   05/21/22 2110 05/22/22 0357  BP:  125/74  Pulse: (!) 110 (!) 110  Resp:  18  Temp:  (!) 36.4 C  SpO2: 95% 96%    Last Pain:  Vitals:   05/22/22 0302  TempSrc:   PainSc: 3                  Lenard Simmer

## 2022-05-23 DIAGNOSIS — A4150 Gram-negative sepsis, unspecified: Secondary | ICD-10-CM | POA: Diagnosis not present

## 2022-05-23 DIAGNOSIS — N390 Urinary tract infection, site not specified: Secondary | ICD-10-CM | POA: Diagnosis not present

## 2022-05-23 LAB — CBC
HCT: 33.8 % — ABNORMAL LOW (ref 36.0–46.0)
Hemoglobin: 10.2 g/dL — ABNORMAL LOW (ref 12.0–15.0)
MCH: 24.9 pg — ABNORMAL LOW (ref 26.0–34.0)
MCHC: 30.2 g/dL (ref 30.0–36.0)
MCV: 82.4 fL (ref 80.0–100.0)
Platelets: 405 10*3/uL — ABNORMAL HIGH (ref 150–400)
RBC: 4.1 MIL/uL (ref 3.87–5.11)
RDW: 17 % — ABNORMAL HIGH (ref 11.5–15.5)
WBC: 17.7 10*3/uL — ABNORMAL HIGH (ref 4.0–10.5)
nRBC: 0 % (ref 0.0–0.2)

## 2022-05-23 MED ORDER — SIMETHICONE 80 MG PO CHEW
160.0000 mg | CHEWABLE_TABLET | Freq: Once | ORAL | Status: AC
  Administered 2022-05-23: 160 mg via ORAL
  Filled 2022-05-23: qty 2

## 2022-05-23 MED ORDER — OXYCODONE-ACETAMINOPHEN 5-325 MG PO TABS: 1.0000 | ORAL_TABLET | ORAL | 0 refills | Status: AC | PRN

## 2022-05-23 MED ORDER — SERTRALINE HCL 50 MG PO TABS
200.0000 mg | ORAL_TABLET | Freq: Every day | ORAL | Status: DC
Start: 2022-05-23 — End: 2022-05-24
  Administered 2022-05-23 – 2022-05-24 (×2): 200 mg via ORAL
  Filled 2022-05-23 (×2): qty 4

## 2022-05-23 MED ORDER — DULOXETINE HCL 30 MG PO CPEP
60.0000 mg | ORAL_CAPSULE | Freq: Every day | ORAL | Status: DC
Start: 2022-05-23 — End: 2022-05-24
  Administered 2022-05-23 – 2022-05-24 (×2): 60 mg via ORAL
  Filled 2022-05-23 (×2): qty 2

## 2022-05-23 NOTE — Progress Notes (Signed)
Physical Therapy Treatment Patient Details Name: Bridget Williams MRN: [id number] DOB: 1956-01-17 Today's Date: 05/23/2022   History of Present Illness 66 y.o. female who presented to the ED with abdominal pain with nausea and vomiting. Imaging found abdominal hernia. Pt is s/p repair of recurrent ventral hernia with small bowel obstruction on 05/20/22. PMH: GERD, obesity, hypothyroidism and ventral hernia.    PT Comments    Pt was able to able to show improved mobility and gait tolerance.  Pt was able to circumambulate the nurses' station with consistent and safe cadence, did not overly rely on the walker and though her HR was high (120s) and O2 relatively low (87-91%) they were consistent t/o the effort and she did not endorse excessive fatigue.  Overall pt doing well and making good gains, had hoped to go home instead of rehab and today did well enough that this is a safe option with some assistance from family.    Recommendations for follow up therapy are one component of a multi-disciplinary discharge planning process, led by the attending physician.  Recommendations may be updated based on patient status, additional functional criteria and insurance authorization.  Follow Up Recommendations  Home health PT Can patient physically be transported by private vehicle: Yes   Assistance Recommended at Discharge Frequent or constant Supervision/Assistance  Patient can return home with the following A little help with walking and/or transfers;A little help with bathing/dressing/bathroom;Assistance with cooking/housework;Assist for transportation;Help with stairs or ramp for entrance   Equipment Recommendations  Rolling walker (2 wheels)    Recommendations for Other Services       Precautions / Restrictions Precautions Precautions: Fall Restrictions Weight Bearing Restrictions: No     Mobility  Bed Mobility Overal bed mobility: Needs Assistance Bed Mobility: Rolling, Sidelying to Sit,  Sit to Supine Rolling: Min guard Sidelying to sit: Min guard       General bed mobility comments: Pt able to get herself up to sitting w/o intervention, did need bed rails    Transfers Overall transfer level: Modified independent Equipment used: Rolling walker (2 wheels) Transfers: Sit to/from Stand Sit to Stand: Min guard           General transfer comment: Able to rise and mantain balance with walker but no assist    Ambulation/Gait Ambulation/Gait assistance: Min guard   Assistive device: Rolling walker (2 wheels)         General Gait Details: Continues to show imrpoved guarding and speed, though still far from her baseline.  HR ~120 at rest but stays in the 120s with prolonged ambulation. O2 on room air remains high 80s to low 90s.   Stairs             Wheelchair Mobility    Modified Rankin (Stroke Patients Only)       Balance Overall balance assessment: Modified Independent                                          Cognition Arousal/Alertness: Awake/alert Behavior During Therapy: WFL for tasks assessed/performed Overall Cognitive Status: Within Functional Limits for tasks assessed                                          Exercises General Exercises - Lower Extremity Long Arc Quad:  Strengthening, 10 reps Heel Slides: Strengthening, 10 reps Hip ABduction/ADduction: Strengthening, 10 reps Hip Flexion/Marching: Strengthening, 10 reps    General Comments        Pertinent Vitals/Pain Pain Assessment Pain Assessment: 0-10 Pain Score: 4  Pain Location: abdominal surgical site Pain Intervention(s):  (abdominal brace)    Home Living Family/patient expects to be discharged to:: Unsure Living Arrangements: Alone                      Prior Function            PT Goals (current goals can now be found in the care plan section) Progress towards PT goals: Progressing toward goals    Frequency     Min 2X/week      PT Plan Discharge plan needs to be updated    Co-evaluation              AM-PAC PT "6 Clicks" Mobility   Outcome Measure  Help needed turning from your back to your side while in a flat bed without using bedrails?: A Little Help needed moving from lying on your back to sitting on the side of a flat bed without using bedrails?: A Little Help needed moving to and from a bed to a chair (including a wheelchair)?: None Help needed standing up from a chair using your arms (e.g., wheelchair or bedside chair)?: None Help needed to walk in hospital room?: None Help needed climbing 3-5 steps with a railing? : A Little 6 Click Score: 21    End of Session Equipment Utilized During Treatment: Gait belt Activity Tolerance: Patient tolerated treatment well Patient left: with bed alarm set;with call bell/phone within reach;with family/visitor present Nurse Communication: Mobility status PT Visit Diagnosis: Muscle weakness (generalized) (M62.81);Difficulty in walking, not elsewhere classified (R26.2);Pain Pain - part of body:  (abdominal)     Time: 0973-5329 PT Time Calculation (min) (ACUTE ONLY): 35 min  Charges:  $Gait Training: 8-22 mins $Therapeutic Exercise: 8-22 mins                     Malachi Pro, DPT 05/23/2022, 12:17 PM

## 2022-05-23 NOTE — Progress Notes (Signed)
Patient ID: Bridget Williams, female   DOB: 1956/01/18, 66 y.o.   MRN: [id number]     SURGICAL PROGRESS NOTE   Hospital Day(s): 3.   Interval History: Patient seen and examined, no acute events or new complaints overnight. Patient reports feeling better this morning.  She endorses that she is passing gas.  Still no bowel movement but no nausea or vomiting.  Endorses that the pain is better controlled.  Vital signs in last 24 hours: [min-max] current  Temp:  [98.4 F (36.9 C)-99.3 F (37.4 C)] 98.6 F (37 C) (07/27 0352) Pulse Rate:  [107-123] 123 (07/27 0352) Resp:  [16-20] 20 (07/27 0352) BP: (133-160)/(66-90) 160/90 (07/27 0352) SpO2:  [93 %-98 %] 94 % (07/27 0352)     Height: 5' (152.4 cm) Weight: 93 kg BMI (Calculated): 40.04   Physical Exam:  Constitutional: alert, cooperative and no distress  Respiratory: breathing non-labored at rest  Cardiovascular: regular rate and sinus rhythm  Gastrointestinal: soft, non-tender, and non-distended.  The wounds are clean  Labs:     Latest Ref Rng & Units 05/22/2022    8:46 AM 05/21/2022    4:41 AM 05/20/2022    1:31 PM  CBC  WBC 4.0 - 10.5 K/uL 20.5  25.1  21.0   Hemoglobin 12.0 - 15.0 g/dL 67.3  41.9  37.9   Hematocrit 36.0 - 46.0 % 37.4  37.4  42.2   Platelets 150 - 400 K/uL 468  488  519       Latest Ref Rng & Units 05/21/2022    4:41 AM 05/20/2022    1:31 PM 01/07/2016    8:43 AM  CMP  Glucose 70 - 99 mg/dL 024  097  353   BUN 8 - 23 mg/dL 13  12  11    Creatinine 0.44 - 1.00 mg/dL  2.99  2.42   Sodium 135 - 145 mmol/L 140  143  140   Potassium 3.5 - 5.1 mmol/L 4.1  4.3  3.9   Chloride 98 - 111 mmol/L 108  103  106   CO2 22 - 32 mmol/L 23  29  26    Calcium 8.9 - 10.3 mg/dL 8.2  9.6  8.4   Total Protein 6.5 - 8.1 g/dL  7.8    Total Bilirubin 0.3 - 1.2 mg/dL  1.1    Alkaline Phos 38 - 126 U/L  104    AST 15 - 41 U/L  24    ALT 0 - 44 U/L  24      Imaging studies: No new pertinent imaging studies   Assessment/Plan:   66 y.o. female with recurrent ventral hernia with obstruction 2 Day Post-Op s/p ventral hernia repair, complicated by pertinent comorbidities including UTI, morbid obesity.   Recurrent ventral hernia with obstruction -S/p ventral hernia repair yesterday.  No sign of strangulation -Continue pain management.  Currently adequately controlled -Tolerating soft diet without nausea or vomiting.  Patient passing gas -Continue MiraLAX for constipation -I encouraged patient to ambulate -PT/OT evaluation and management.  Patient declined skilled nursing facility and home health -I discontinue Praveena.  Wound is dry and clean and healing adequately.  No sign of infection -From the ventral hernia standpoint, patient can be discharged home if she has completed IV antibiotic therapy or if they can be transition to oral antibiotic for UTI   UTI -Present on admission -Continue antibiotic therapy as per hospitalist  , MD

## 2022-05-23 NOTE — TOC Progression Note (Signed)
Transition of Care [facility]) - Progression Note    Patient Details  Name: Bridget Williams MRN: [id number] Date of Birth: Mar 24, 1956  Transition of Care [facility]) CM/SW Contact  Chapman Fitch, RN Phone Number: 05/23/2022, 1:28 PM  Clinical Narrative:     Per MD she will be ordering home health at discharge.  Followed back up with patient today and she confirms she does not want home health services at discharge        Expected Discharge Plan and Services                                                 Social Determinants of Health (SDOH) Interventions    Readmission Risk Interventions     No data to display

## 2022-05-23 NOTE — Discharge Instructions (Signed)

## 2022-05-23 NOTE — Progress Notes (Signed)
PROGRESS NOTE    Bridget Williams  WUJ:[id number] DOB: Sep 19, 1956 DOA: 05/20/2022 PCP: Mickey Farber, MD    Brief Narrative:  Bridget Williams is a 66 y.o. female with medical history significant for GERD, obesity, hypothyroidism and ventral hernia, who presented to the ER with acute onset of worsening abdominal pain over the last couple of days with associated intractable nausea and vomiting.  She had a bowel movement with diarrhea today.  She denies any fever or chills.  No chest pain or dyspnea or cough or wheezing.  She admitted to dysuria without urinary frequency or urgency or hematuria or flank pain.  No headache or dizziness or blurred vision.  No melena or bright red bleeding per rectum.  She denies any bilious vomitus or hematemesis.  The patient was seen in the PACU postoperatively.   ED Course: When she came to the ER, BP was 150/114 with otherwise normal vital signs.  Labs revealed unremarkable CMP.  CBC showed leukocytosis of 21 with neutrophilia.  UA was positive for UTI.  Urine culture was sent.  7/27 was discontinued. +flatus, no bm  Consultants:  surgery  Procedures:   Antimicrobials:  ceftriaxone   Subjective: No nausea or vomiting mild abdominal pain feels weak Objective: Vitals:   05/22/22 1956 05/22/22 2001 05/23/22 0352 05/23/22 0743  BP:   (!) 160/90 111/60  Pulse: (!) 113 (!) 114 (!) 123 (!) 117  Resp:   20 18  Temp:   98.6 F (37 C) 98.9 F (37.2 C)  TempSrc:   Oral Oral  SpO2:  93% 94% 91%  Weight:      Height:        Intake/Output Summary (Last 24 hours) at 05/23/2022 1330 Last data filed at 05/23/2022 0308 Gross per 24 hour  Intake 779.02 ml  Output 0 ml  Net 779.02 ml   Filed Weights   05/20/22 1330  Weight: 93 kg    Examination: Calm, NAD Cta no w/r Reg s1/s2 no gallop Soft  distended +bs No edema Aaoxox3  Mood and affect appropriate in current setting     Data Reviewed: I have personally reviewed following labs and imaging  studies  CBC: Recent Labs  Lab 05/20/22 1331 05/21/22 0441 05/22/22 0846 05/23/22 0942  WBC 21.0* 25.1* 20.5* 17.7*  NEUTROABS 17.5*  --   --   --   HGB 12.9 11.5* 11.2* 10.2*  HCT 42.2 37.4 37.4 33.8*  MCV 81.3 82.6 83.7 82.4  PLT 519* 488* 468* 405*   Basic Metabolic Panel: Recent Labs  Lab 05/20/22 1331 05/21/22 0441  NA 143 140  K 4.3 4.1  CL 103 108  CO2 29 23  GLUCOSE 111* 115*  BUN 12 13  CREATININE 1.10* 0.93  CALCIUM 9.6 8.2*   GFR: Estimated Creatinine Clearance: 62.2 mL/min (by C-G formula based on SCr of 0.93 mg/dL). Liver Function Tests: Recent Labs  Lab 05/20/22 1331  AST 24  ALT 24  ALKPHOS 104  BILITOT 1.1  PROT 7.8  ALBUMIN 3.5   Recent Labs  Lab 05/20/22 1331  LIPASE 20   No results for input(s): "AMMONIA" in the last 168 hours. Coagulation Profile: Recent Labs  Lab 05/21/22 0441  INR 1.1   Cardiac Enzymes: No results for input(s): "CKTOTAL", "CKMB", "CKMBINDEX", "TROPONINI" in the last 168 hours. BNP (last 3 results) No results for input(s): "PROBNP" in the last 8760 hours. HbA1C: No results for input(s): "HGBA1C" in the last 72 hours. CBG: No results for input(s): "  GLUCAP" in the last 168 hours. Lipid Profile: No results for input(s): "CHOL", "HDL", "LDLCALC", "TRIG", "CHOLHDL", "LDLDIRECT" in the last 72 hours. Thyroid Function Tests: No results for input(s): "TSH", "T4TOTAL", "FREET4", "T3FREE", "THYROIDAB" in the last 72 hours. Anemia Panel: No results for input(s): "VITAMINB12", "FOLATE", "FERRITIN", "TIBC", "IRON", "RETICCTPCT" in the last 72 hours. Sepsis Labs: Recent Labs  Lab 05/21/22 0441  PROCALCITON <0.10    Recent Results (from the past 240 hour(s))  Urine Culture     Status: Abnormal   Collection Time: 05/20/22  1:31 PM   Specimen: Urine, Clean Catch  Result Value Ref Range Status   Specimen Description   Final    URINE, CLEAN CATCH Performed at [facility], [address], [place],  [place] [address]    Special Requests   Final    NONE Performed at [facility], [address] [place], [place] [address]    Culture MULTIPLE SPECIES PRESENT, SUGGEST RECOLLECTION (A)  Final   Report Status 05/21/2022 FINAL  Final         Radiology Studies: No results found.      Scheduled Meds:  aspirin EC  81 mg Oral Daily   DULoxetine  60 mg Oral Daily   enoxaparin (LOVENOX) injection  0.5 mg/kg Subcutaneous Q24H   levothyroxine  150 mcg Oral QAC breakfast   multivitamin with minerals  1 tablet Oral Daily   pantoprazole  40 mg Oral Daily   polyethylene glycol  17 g Oral BID   Continuous Infusions:  sodium chloride 75 mL/hr at 05/23/22 1215   piperacillin-tazobactam (ZOSYN)  IV 3.375 g (05/23/22 0828)    Assessment & Plan:   Principal Problem:   Sepsis due to gram-negative UTI (HCC) Active Problems:   Incarcerated ventral hernia   Hypothyroidism   Essential hypertension   Depression   GERD without esophagitis   Sepsis  Likely due to incarcerated ventral hernia and UTI. Change rocephin to zosyn to cover intra-abd infection S/p ventral hernia repair on 05/21/22 . No sign of strangulation per surgery S/p wound vac prevena. 7/27 wbc trending down Wound VAC discontinue Continue IV antibiotics   Incarcerated ventral hernia S/p repair Continue iv abx as above Pain control Wound vac as above 7/26Ambulate as tolerated MiraLAX for constipation 7/27 WBC trending down No BM past flatus Wound VAC discontinued Continue with ambulation and IV antibiotics If WBC remains trending down and tolerates p.o. will DC tomorrow   Hypothyroidism Continue Synthroid   GERD without esophagitis Continue PPI   Depression Stable     Essential hypertension Stable continue IV BP meds  DVT prophylaxis: Lovenox Code Status: Full Family Communication: None at bedside Disposition Plan:  Status is: Inpatient Remains inpatient appropriate because: IV treatment         LOS: 3 days   Time spent: 35 minutes    Lynn Ito, MD [facility] Pager [phone]  If 7PM-7AM, please contact night-coverage 05/23/2022, 1:30 PM

## 2022-05-24 DIAGNOSIS — N390 Urinary tract infection, site not specified: Secondary | ICD-10-CM | POA: Diagnosis not present

## 2022-05-24 DIAGNOSIS — A4150 Gram-negative sepsis, unspecified: Secondary | ICD-10-CM | POA: Diagnosis not present

## 2022-05-24 LAB — CBC
HCT: 32.8 % — ABNORMAL LOW (ref 36.0–46.0)
Hemoglobin: 10 g/dL — ABNORMAL LOW (ref 12.0–15.0)
MCH: 25.5 pg — ABNORMAL LOW (ref 26.0–34.0)
MCHC: 30.5 g/dL (ref 30.0–36.0)
MCV: 83.7 fL (ref 80.0–100.0)
Platelets: 370 10*3/uL (ref 150–400)
RBC: 3.92 MIL/uL (ref 3.87–5.11)
RDW: 16.9 % — ABNORMAL HIGH (ref 11.5–15.5)
WBC: 14.4 10*3/uL — ABNORMAL HIGH (ref 4.0–10.5)
nRBC: 0.1 % (ref 0.0–0.2)

## 2022-05-24 MED ORDER — ACETAMINOPHEN 325 MG PO TABS: 650.0000 mg | ORAL_TABLET | Freq: Four times a day (QID) | ORAL | Status: AC | PRN

## 2022-05-24 MED ORDER — AMOXICILLIN-POT CLAVULANATE 875-125 MG PO TABS
1.0000 | ORAL_TABLET | Freq: Two times a day (BID) | ORAL | Status: DC
  Administered 2022-05-24: 1 via ORAL
  Filled 2022-05-24: qty 1

## 2022-05-24 MED ORDER — POLYETHYLENE GLYCOL 3350 17 G PO PACK: 17.0000 g | PACK | Freq: Two times a day (BID) | ORAL | 0 refills | Status: AC

## 2022-05-24 MED ORDER — AMOXICILLIN-POT CLAVULANATE 875-125 MG PO TABS: 1.0000 | ORAL_TABLET | Freq: Two times a day (BID) | ORAL | 0 refills | Status: AC

## 2022-05-24 NOTE — Progress Notes (Signed)
Patient ID: Bridget Williams, female   DOB: 09-03-1956, 66 y.o.   MRN: [id number]     SURGICAL PROGRESS NOTE   Hospital Day(s): 4.   Interval History: Patient seen and examined, no acute events or new complaints overnight. Patient reports slowly feeling better.  She endorses that the pain is slowly getting better control.  She has been able to ambulate a little bit more.  Denies any nausea or vomiting.  She endorses having had a bowel movement.  Vital signs in last 24 hours: [min-max] current  Temp:  [98.3 F (36.8 C)-99.8 F (37.7 C)] 98.3 F (36.8 C) (07/28 0334) Pulse Rate:  [110-117] 110 (07/28 0334) Resp:  [17-19] 17 (07/28 0334) BP: (111-158)/(60-92) 144/92 (07/28 0334) SpO2:  [90 %-93 %] 90 % (07/28 0334)     Height: 5' (152.4 cm) Weight: 93 kg BMI (Calculated): 40.04   Physical Exam:  Constitutional: alert, cooperative and no distress  Respiratory: breathing non-labored at rest  Cardiovascular: regular rate and sinus rhythm  Gastrointestinal: soft, non-tender, and non-distended.  The wound is dry and clean.  Labs:     Latest Ref Rng & Units 05/24/2022    4:50 AM 05/23/2022    9:42 AM 05/22/2022    8:46 AM  CBC  WBC 4.0 - 10.5 K/uL 14.4  17.7  20.5   Hemoglobin 12.0 - 15.0 g/dL 64.4  03.4  74.2   Hematocrit 36.0 - 46.0 % 32.8  33.8  37.4   Platelets 150 - 400 K/uL 370  405  468       Latest Ref Rng & Units 05/21/2022    4:41 AM 05/20/2022    1:31 PM 01/07/2016    8:43 AM  CMP  Glucose 70 - 99 mg/dL 595  638  756   BUN 8 - 23 mg/dL 13  12  11    Creatinine 0.44 - 1.00 mg/dL  4.33  2.95   Sodium 135 - 145 mmol/L 140  143  140   Potassium 3.5 - 5.1 mmol/L 4.1  4.3  3.9   Chloride 98 - 111 mmol/L 108  103  106   CO2 22 - 32 mmol/L 23  29  26    Calcium 8.9 - 10.3 mg/dL 8.2  9.6  8.4   Total Protein 6.5 - 8.1 g/dL  7.8    Total Bilirubin 0.3 - 1.2 mg/dL  1.1    Alkaline Phos 38 - 126 U/L  104    AST 15 - 41 U/L  24    ALT 0 - 44 U/L  24      Imaging studies: No  new pertinent imaging studies   Assessment/Plan:  66 y.o. female with recurrent ventral hernia with obstruction 4 Day Post-Op s/p ventral hernia repair, complicated by pertinent comorbidities including UTI, morbid obesity.   Recurrent ventral hernia with obstruction -S/p ventral hernia repair.  No sign of strangulation -Continue pain management.  Currently adequately controlled -Tolerating soft diet without nausea or vomiting.  Patient passing gas and had a bowel movement yesterday. -Continue MiraLAX for constipation -I encouraged patient to ambulate -PT/OT evaluation and management.  Patient declined skilled nursing facility and home health -I discontinue Praveena.  Wound is dry and clean and healing adequately.  No sign of infection.  Patient may shower -White blood cell count continues trending down -From the ventral hernia standpoint, patient can be discharged home if she has completed IV antibiotic therapy or if they can be transition to oral antibiotic  for UTI   UTI -Present on admission -Continue antibiotic therapy as per hospitalist  Gae Gallop, MD

## 2022-05-24 NOTE — Progress Notes (Signed)
Discharge instructions were reviewed with pt. Questions were encourage and answerer. IVF were stopped and IV was taken out. Belongings were given to pt. Awaiting on pt's ride.

## 2022-05-24 NOTE — Plan of Care (Signed)

## 2022-05-24 NOTE — Discharge Summary (Signed)
Bridget Williams ELF:[id number] DOB: 09/19/1956 DOA: 05/20/2022  PCP: Mickey Farber, MD  Admit date: 05/20/2022 Discharge date: 05/24/2022  Admitted From: Home Disposition: Home, declined SNF  Recommendations for Outpatient Follow-up:  Follow up with PCP in 1 week Please obtain BMP/CBC in one week Please follow up surgery  Home Health: Declined   Discharge Condition:Stable CODE STATUS: Full Diet recommendation: Heart Healthy   Brief/Interim Summary: Per HEN:IDPOEUMPNTIRWE and ventral hernia, who presented to the ER with acute onset of worsening abdominal pain over the last couple of days with associated intractable nausea and vomiting.  She had a bowel movement with diarrhea. The patient was seen in the PACU postoperatively.ED Course: When she came to the ER, BP was 150/114 with otherwise normal vital signs.  Labs revealed unremarkable CMP.  CBC showed leukocytosis of 21 with neutrophilia.  UA was positive for UTI.  She was found with recurrent ventral hernia with obstruction but no signs of strangulation.  She is status post ventral hernia repair.  She has been cleared by surgery for discharge.  Sepsis  Likely due to incarcerated ventral hernia and UTI. S/p ventral hernia repair on 05/21/22 . No sign of strangulation per surgery Wbc trended down Dc'd on po antibiotics     Incarcerated ventral hernia S/p repair Started on IV antibiotics  Wound VAC removed  Cleared for discharge from surgery standpoint     Hypothyroidism Continue Synthroid   GERD without esophagitis Continue PPI   Depression Stable Continue home meds       Essential hypertension Home meds     Discharge Diagnoses:  Principal Problem:   Sepsis due to gram-negative UTI [facility]) Active Problems:   Incarcerated ventral hernia   Hypothyroidism   Essential hypertension   Depression   GERD without esophagitis    Discharge Instructions  Discharge Instructions     Diet - low sodium heart healthy    Complete by: As directed    Discharge instructions   Complete by: As directed    Continue soft diet and advance as tolerated Follow-up with surgeon and new PCP   Discharge wound care:   Complete by: As directed    As instructed by your surgeon   Increase activity slowly   Complete by: As directed       Allergies as of 05/24/2022   No Known Allergies      Medication List     TAKE these medications    acetaminophen 325 MG tablet Commonly known as: TYLENOL Take 2 tablets (650 mg total) by mouth every 6 (six) hours as needed for mild pain (or Fever >/= 101). What changed:  medication strength how much to take reasons to take this   amoxicillin-clavulanate 875-125 MG tablet Commonly known as: AUGMENTIN Take 1 tablet by mouth every 12 (twelve) hours for 4 days.   aspirin EC 81 MG tablet Take 81 mg by mouth daily.   DULoxetine 60 MG capsule Commonly known as: CYMBALTA Take 1 capsule by mouth every morning.   hydrOXYzine 25 MG capsule Commonly known as: VISTARIL Take 1 capsule by mouth daily as needed.   levothyroxine 137 MCG tablet Commonly known as: SYNTHROID Take 100 mcg by mouth daily before breakfast.   lisinopril 5 MG tablet Commonly known as: ZESTRIL Take 5 mg by mouth daily.   multivitamin with minerals Tabs tablet Take 1 tablet by mouth daily.   oxyCODONE-acetaminophen 5-325 MG tablet Commonly known as: Percocet Take 1 tablet by mouth every 4 (four) hours as needed  for severe pain.   pantoprazole 40 MG tablet Commonly known as: PROTONIX Take 40 mg by mouth daily.   polyethylene glycol 17 g packet Commonly known as: MIRALAX / GLYCOLAX Take 17 g by mouth 2 (two) times daily.   sertraline 100 MG tablet Commonly known as: ZOLOFT Take 200 mg by mouth daily.   traZODone 100 MG tablet Commonly known as: DESYREL Take 100 mg by mouth at bedtime as needed.   Vitamin D (Ergocalciferol) 1.25 MG (50000 UNIT) Caps capsule Commonly known as:  DRISDOL Take 50,000 Units by mouth every 30 (thirty) days. Pt takes on the 1st of every month.               Durable Medical Equipment  (From admission, onward)           Start     Ordered   05/22/22 1559  For home use only DME Walker rolling  Once       Question Answer Comment  Walker: With 5 Inch Wheels   Patient needs a walker to treat with the following condition Weakness      05/22/22 1559              Discharge Care Instructions  (From admission, onward)           Start     Ordered   05/24/22 0000  Discharge wound care:       Comments: As instructed by your surgeon   05/24/22 1021            Follow-up Information     Mickey Farber, MD. Go on 05/31/2022.   Specialty: Internal Medicine Why: Appointment @ 1:30 pm. Contact information: [address] [facility] [place] [address] [phone]                No Known Allergies  Consultations: General surgery   Procedures/Studies: CT ABDOMEN PELVIS WO CONTRAST  Result Date: 05/20/2022 CLINICAL DATA:  Acute nonlocalized abdominal pain. Left-sided abdominal pain since last week. Diarrhea. EXAM: CT ABDOMEN AND PELVIS WITHOUT CONTRAST TECHNIQUE: Multidetector CT imaging of the abdomen and pelvis was performed following the standard protocol without IV contrast. RADIATION DOSE REDUCTION: This exam was performed according to the departmental dose-optimization program which includes automated exposure control, adjustment of the mA and/or kV according to patient size and/or use of iterative reconstruction technique. COMPARISON:  01/05/2016 FINDINGS: Lower chest: Lung bases are clear. Large esophageal hiatal hernia behind the heart. Moderately prominent retrocrural lymph nodes, nonspecific but likely reactive. Hepatobiliary: No focal liver lesions. Cholelithiasis with multiple stones in the gallbladder. No gallbladder wall thickening. Bile ducts are not dilated. Pancreas:  Unremarkable. No pancreatic ductal dilatation or surrounding inflammatory changes. Spleen: Normal in size without focal abnormality. Adrenals/Urinary Tract: No adrenal gland nodules. Multiple stones in the right kidney. Largest measuring about 3 mm in diameter. No hydronephrosis or hydroureter. No ureteral stones are seen. Left kidney, left ureter, and the bladder are unremarkable. Stomach/Bowel: Large esophageal hiatal hernia. Stomach is decompressed. There is a large broad-based ventral abdominal wall hernia containing multiple loops of small bowel and transverse colon. Proximal small bowel are mildly dilated with evidence of diffuse wall thickening. There is however a transition zone within the hernia with decompressed bowel distally. Prominent stranding in the mesenteric fat and in the hernia. Colon is decompressed. Scattered colonic diverticula without evidence of acute diverticulitis. The appendix is normal. Vascular/Lymphatic: No significant vascular findings are present. No enlarged abdominal or pelvic lymph nodes.  Reproductive: Status post hysterectomy. No adnexal masses. Other: No free air or free fluid in the abdomen. Musculoskeletal: No acute or significant osseous findings. IMPRESSION: 1. Large broad-based ventral abdominal wall hernia containing small bowel and transverse colon. Mildly dilated small bowel entering the hernia with decompressed small bowel exiting the hernia consistent with obstruction. Mesenteric stranding and small bowel wall thickening may indicate changes of edema or ischemia and suggest incarceration. No pneumatosis. 2. Nonobstructing intrarenal stones in the right kidney. 3. Cholelithiasis without evidence of acute cholecystitis. 4. Large esophageal hiatal hernia. Electronically Signed   By: Burman Nieves M.D.   On: 05/20/2022 17:23      Subjective:   Discharge Exam: Vitals:   05/24/22 0334 05/24/22 0807  BP: (!) 144/92 136/74  Pulse: (!) 110 (!) 105  Resp: 17 19   Temp: 98.3 F (36.8 C) 98.7 F (37.1 C)  SpO2: 90% 91%   Vitals:   05/23/22 2309 05/24/22 0121 05/24/22 0334 05/24/22 0807  BP:   (!) 144/92 136/74  Pulse: (!) 112 (!) 115 (!) 110 (!) 105  Resp:   17 19  Temp:   98.3 F (36.8 C) 98.7 F (37.1 C)  TempSrc:    Oral  SpO2: 92%  90% 91%  Weight:      Height:        General: Pt is alert, awake, not in acute distress Cardiovascular: RRR, S1/S2 +, no rubs, no gallops Respiratory: CTA bilaterally, no wheezing, no rhonchi Abdominal: Soft, , ND, bowel sounds + Extremities: no edema, no cyanosis    The results of significant diagnostics from this hospitalization (including imaging, microbiology, ancillary and laboratory) are listed below for reference.     Microbiology: Recent Results (from the past 240 hour(s))  Urine Culture     Status: Abnormal   Collection Time: 05/20/22  1:31 PM   Specimen: Urine, Clean Catch  Result Value Ref Range Status   Specimen Description   Final    URINE, CLEAN CATCH Performed at [facility], [address] [place], [place] [address]    Special Requests   Final    NONE Performed at [facility], [address] [place], [place] [address]    Culture MULTIPLE SPECIES PRESENT, SUGGEST RECOLLECTION (A)  Final   Report Status 05/21/2022 FINAL  Final     Labs: BNP (last 3 results) No results for input(s): "BNP" in the last 8760 hours. Basic Metabolic Panel: Recent Labs  Lab 05/20/22 1331 05/21/22 0441  NA 143 140  K 4.3 4.1  CL 103 108  CO2 29 23  GLUCOSE 111* 115*  BUN 12 13  CREATININE 1.10* 0.93  CALCIUM 9.6 8.2*   Liver Function Tests: Recent Labs  Lab 05/20/22 1331  AST 24  ALT 24  ALKPHOS 104  BILITOT 1.1  PROT 7.8  ALBUMIN 3.5   Recent Labs  Lab 05/20/22 1331  LIPASE 20   No results for input(s): "AMMONIA" in the last 168 hours. CBC: Recent Labs  Lab 05/20/22 1331 05/21/22 0441 05/22/22 0846 05/23/22 0942 05/24/22 0450  WBC 21.0*  25.1* 20.5* 17.7* 14.4*  NEUTROABS 17.5*  --   --   --   --   HGB 12.9 11.5* 11.2* 10.2* 10.0*  HCT 42.2 37.4 37.4 33.8* 32.8*  MCV 81.3 82.6 83.7 82.4 83.7  PLT 519* 488* 468* 405* 370   Cardiac Enzymes: No results for input(s): "CKTOTAL", "CKMB", "CKMBINDEX", "TROPONINI" in the last 168 hours. BNP: Invalid input(s): "POCBNP" CBG:  No results for input(s): "GLUCAP" in the last 168 hours. D-Dimer No results for input(s): "DDIMER" in the last 72 hours. Hgb A1c No results for input(s): "HGBA1C" in the last 72 hours. Lipid Profile No results for input(s): "CHOL", "HDL", "LDLCALC", "TRIG", "CHOLHDL", "LDLDIRECT" in the last 72 hours. Thyroid function studies No results for input(s): "TSH", "T4TOTAL", "T3FREE", "THYROIDAB" in the last 72 hours.  Invalid input(s): "FREET3" Anemia work up No results for input(s): "VITAMINB12", "FOLATE", "FERRITIN", "TIBC", "IRON", "RETICCTPCT" in the last 72 hours. Urinalysis    Component Value Date/Time   COLORURINE YELLOW (A) 05/20/2022 1331   APPEARANCEUR CLOUDY (A) 05/20/2022 1331   LABSPEC 1.017 05/20/2022 1331   PHURINE 5.0 05/20/2022 1331   GLUCOSEU NEGATIVE 05/20/2022 1331   HGBUR NEGATIVE 05/20/2022 1331   BILIRUBINUR NEGATIVE 05/20/2022 1331   KETONESUR 20 (A) 05/20/2022 1331   PROTEINUR 30 (A) 05/20/2022 1331   NITRITE POSITIVE (A) 05/20/2022 1331   LEUKOCYTESUR LARGE (A) 05/20/2022 1331   Sepsis Labs Recent Labs  Lab 05/21/22 0441 05/22/22 0846 05/23/22 0942 05/24/22 0450  WBC 25.1* 20.5* 17.7* 14.4*   Microbiology Recent Results (from the past 240 hour(s))  Urine Culture     Status: Abnormal   Collection Time: 05/20/22  1:31 PM   Specimen: Urine, Clean Catch  Result Value Ref Range Status   Specimen Description   Final    URINE, CLEAN CATCH Performed at [facility], [address] [place], [place] [address]    Special Requests   Final    NONE Performed at [facility], [address]  [place], [place] [address]    Culture MULTIPLE SPECIES PRESENT, SUGGEST RECOLLECTION (A)  Final   Report Status 05/21/2022 FINAL  Final     Time coordinating discharge: Over 30 minutes  SIGNED:   Lynn Ito, MD  [facility] 05/24/2022, 3:56 PM Pager   If 7PM-7AM, please contact night-coverage [email or web address] Password [email or web address]

## 2022-05-24 NOTE — Progress Notes (Signed)
Physical Therapy Treatment Patient Details Name: Bridget Williams MRN: [id number] DOB: Nov 01, 1955 Today's Date: 05/24/2022   History of Present Illness 66 y.o. female who presented to the ED with abdominal pain with nausea and vomiting. Imaging found abdominal hernia. Pt is s/p repair of recurrent ventral hernia with small bowel obstruction on 05/20/22. PMH: GERD, obesity, hypothyroidism and ventral hernia.    PT Comments    Pt received standing in room, attempting to clean BM up off floor as pt reports she has diarrhea. PT assisted with clean up. Pt endorses nausea & generally not feeling great - MD notified of nausea. Pt agreeable to BLE strengthening exercises & gait. Pt ambulates into hallway with RW & supervision with gait pattern as noted below. Pt demonstrates need for BUE support during gait at this time & would benefit from use of RW at this time. Pt reports she is anticipating d/c home today.     Recommendations for follow up therapy are one component of a multi-disciplinary discharge planning process, led by the attending physician.  Recommendations may be updated based on patient status, additional functional criteria and insurance authorization.  Follow Up Recommendations  Home health PT Can patient physically be transported by private vehicle: Yes   Assistance Recommended at Discharge Frequent or constant Supervision/Assistance  Patient can return home with the following A little help with walking and/or transfers;A little help with bathing/dressing/bathroom;Assistance with cooking/housework;Assist for transportation;Help with stairs or ramp for entrance   Equipment Recommendations  Rolling walker (2 wheels);BSC/3in1    Recommendations for Other Services       Precautions / Restrictions Precautions Precautions: Fall Restrictions Weight Bearing Restrictions: No     Mobility  Bed Mobility               General bed mobility comments: pt received standing up in  room, left in recliner    Transfers Overall transfer level: Modified independent Equipment used: None Transfers: Sit to/from Stand                  Ambulation/Gait Ambulation/Gait assistance: Supervision Gait Distance (Feet): 80 Feet Assistive device: Rolling walker (2 wheels)   Gait velocity: decreased     General Gait Details: Pt with slight forward trunk lean & pushing RW out in front, rigid elbows, clearly requires UE support at this time. PT educates pt to ambulate within base of AD & for relaxed BUE but poor return demo.   Stairs             Wheelchair Mobility    Modified Rankin (Stroke Patients Only)       Balance Overall balance assessment: Modified Independent                                          Cognition Arousal/Alertness: Awake/alert Behavior During Therapy: WFL for tasks assessed/performed Overall Cognitive Status: Within Functional Limits for tasks assessed                                          Exercises General Exercises - Lower Extremity Hip Flexion/Marching: Strengthening, 10 reps, AROM, Both, Standing (BUE support on RW) Other Exercises Other Exercises: Pt performs 7x STS without BUE support with focus on BLE strengthening    General Comments  Pertinent Vitals/Pain Pain Assessment Pain Assessment: No/denies pain    Home Living                          Prior Function            PT Goals (current goals can now be found in the care plan section) Acute Rehab PT Goals Patient Stated Goal: go home PT Goal Formulation: With patient Time For Goal Achievement: 06/03/22 Potential to Achieve Goals: Good Progress towards PT goals: Progressing toward goals    Frequency    Min 2X/week      PT Plan Current plan remains appropriate;Equipment recommendations need to be updated    Co-evaluation              AM-PAC PT "6 Clicks" Mobility   Outcome Measure   Help needed turning from your back to your side while in a flat bed without using bedrails?: None Help needed moving from lying on your back to sitting on the side of a flat bed without using bedrails?: None Help needed moving to and from a bed to a chair (including a wheelchair)?: None Help needed standing up from a chair using your arms (e.g., wheelchair or bedside chair)?: None Help needed to walk in hospital room?: A Little Help needed climbing 3-5 steps with a railing? : A Little 6 Click Score: 22    End of Session   Activity Tolerance: Patient tolerated treatment well;Patient limited by fatigue (limited by nausea & generally feeling unwell) Patient left: in chair;with call bell/phone within reach;with family/visitor present Nurse Communication:  (notified MD of pt's c/o nausea) PT Visit Diagnosis: Muscle weakness (generalized) (M62.81);Difficulty in walking, not elsewhere classified (R26.2)     Time: 2130-8657 PT Time Calculation (min) (ACUTE ONLY): 18 min  Charges:  $Therapeutic Activity: 8-22 mins                     Aleda Grana, PT, DPT 05/24/22, 10:13 AM   Sandi Mariscal 05/24/2022, 10:12 AM

## 2022-05-24 NOTE — TOC Transition Note (Signed)
Transition of Care [facility]) - CM/SW Discharge Note   Patient Details  Name: Delaina Fetsch MRN: [id number] Date of Birth: [birth year]  Transition of [facility] CM/SW Contact:  Liliana Cline, LCSW Phone Number: 05/24/2022, 10:39 AM   Clinical Narrative:    Patient is discharging home today.  Spoke to patient who continues to refuse home health.  Patient declines 3in1, says her brother is going to get her one from a thrift shop.  Patient states her brother will transport her home today. Declined additional needs prior to DC.   Final next level of care: Home/Self Care Barriers to Discharge: Barriers Resolved   Patient Goals and CMS Choice Patient states their goals for this hospitalization and ongoing recovery are:: home, declines [facility] CMS Medicare.gov Compare Post Acute Care list provided to:: Patient Choice offered to / list presented to : Patient  Discharge Placement                Patient to be transferred to facility by: brother to transport home Name of family member notified: patient Patient and family notified of of transfer: 05/24/22  Discharge Plan and Services                                     Social Determinants of Health (SDOH) Interventions     Readmission Risk Interventions     No data to display

## 2022-07-18 IMAGING — MG MM DIGITAL SCREENING BILAT W/ TOMO AND CAD
8 series · 8 of 24 positions shown · non-contrast
Comparison: Previous exam(s).

CLINICAL DATA: Screening.

EXAM:
DIGITAL SCREENING BILATERAL MAMMOGRAM WITH TOMOSYNTHESIS AND CAD
TECHNIQUE: Bilateral screening digital craniocaudal and mediolateral oblique
mammograms were obtained. Bilateral screening digital breast
tomosynthesis was performed. The images were evaluated with
computer-aided detection.

[R CC synth-2D]
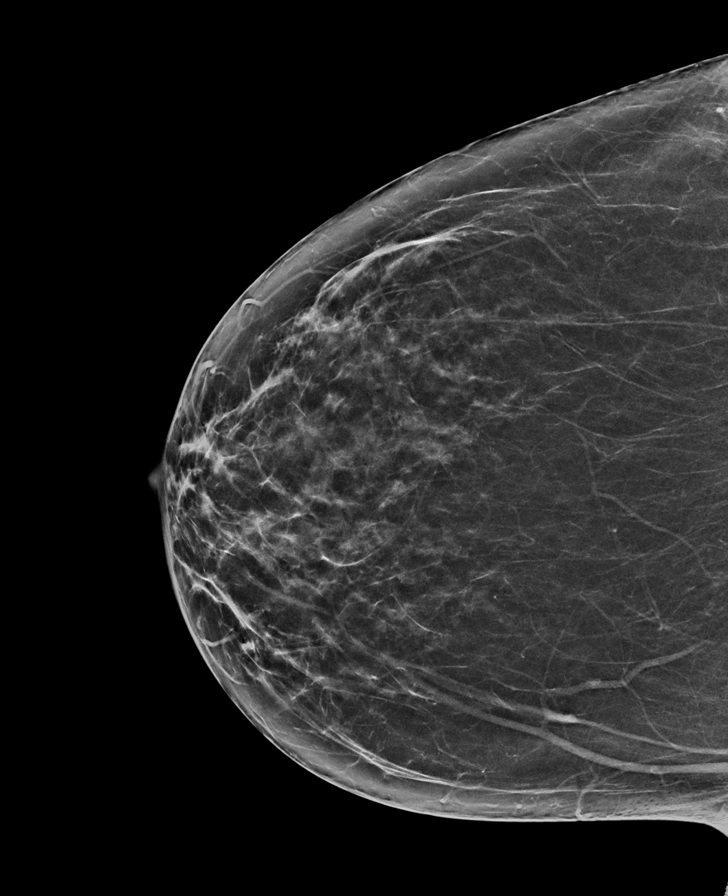

[R MLO synth-2D]
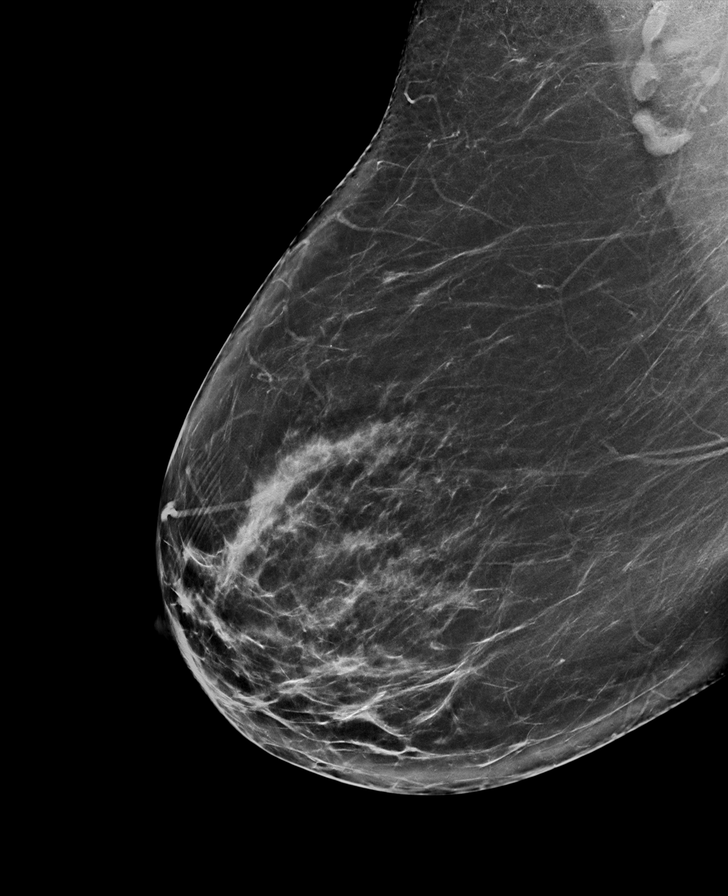

[L MLO synth-2D]
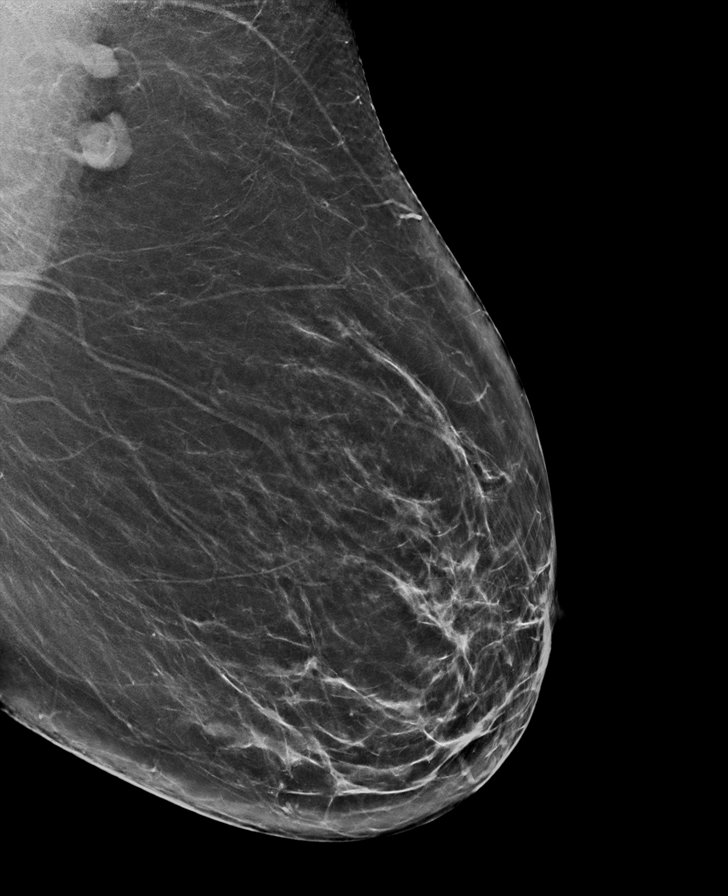

[L CC synth-2D]
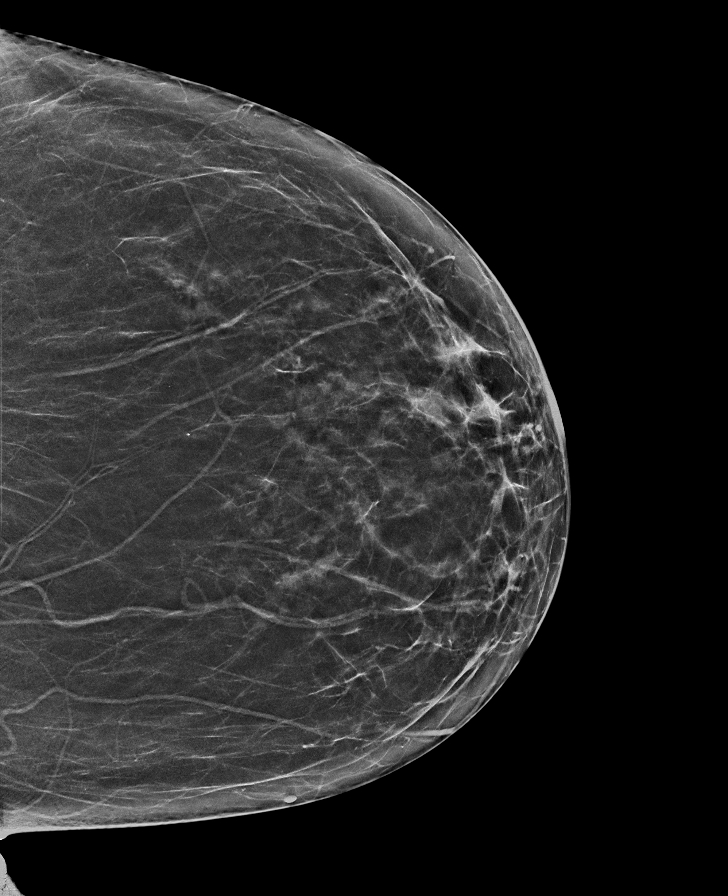

[L CC tomo · tomo slice 34/67.0]
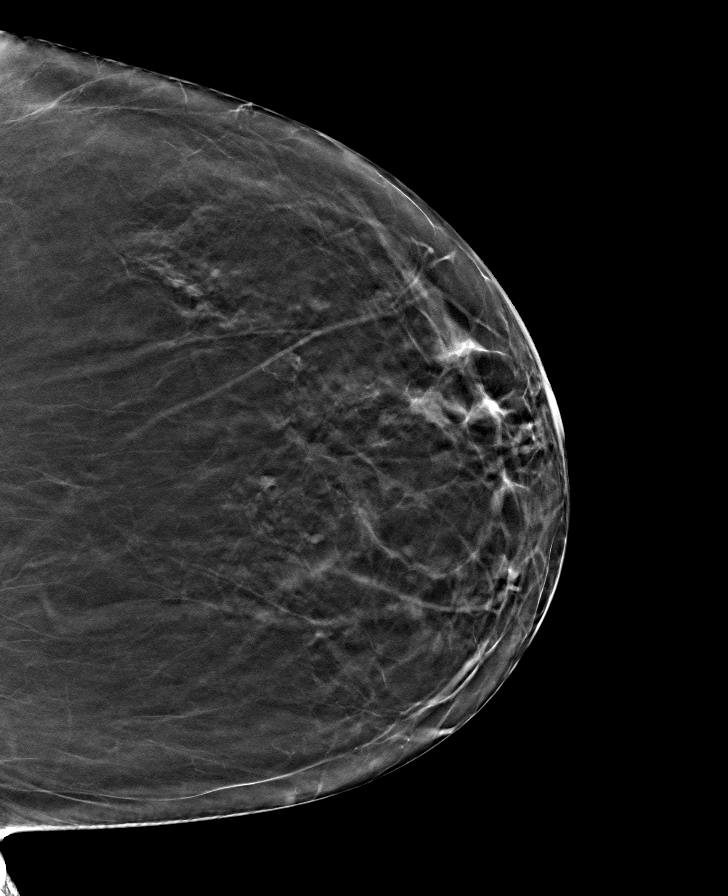

[R MLO tomo · tomo slice 41/81.0]
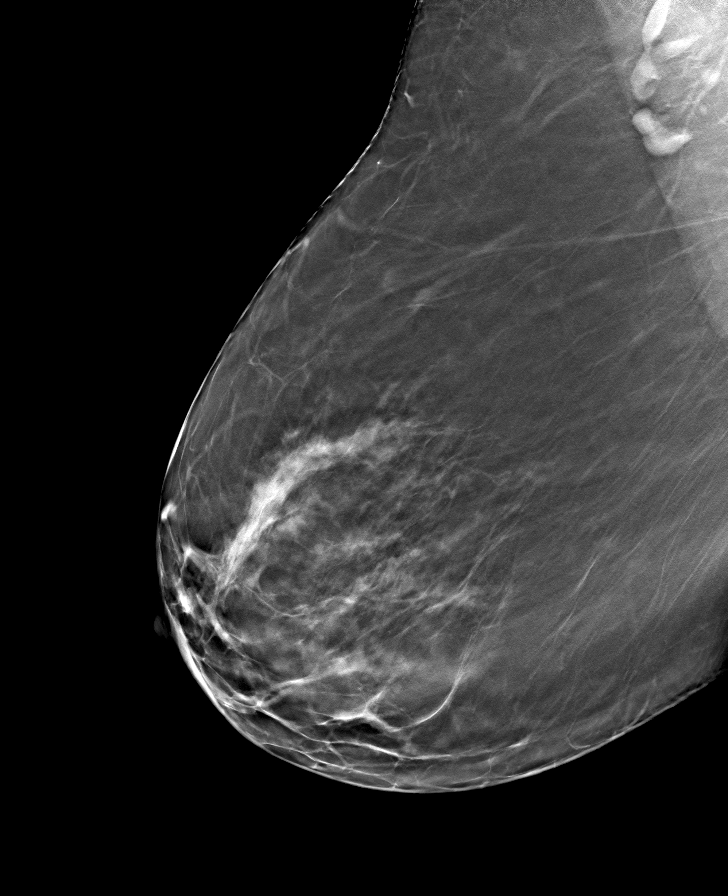

[R CC tomo · tomo slice 33/66.0]
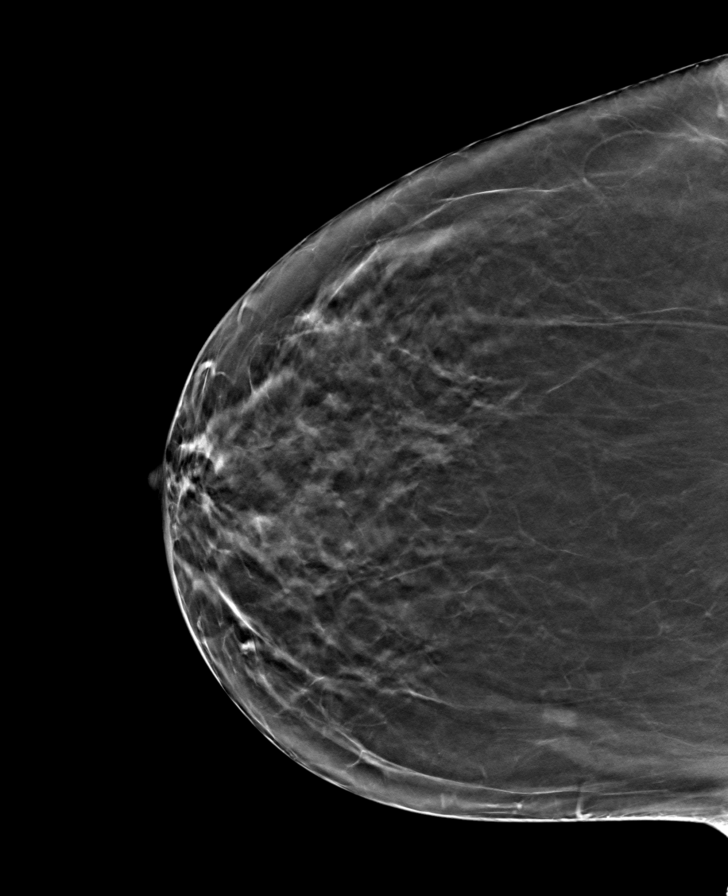

[L MLO tomo · tomo slice 41/80.0]
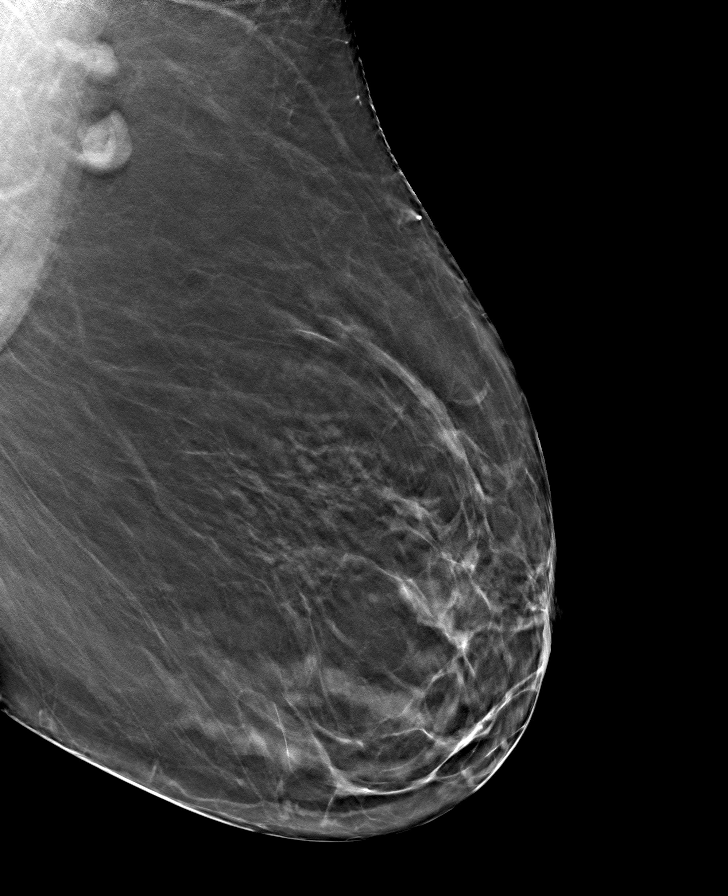

[8 of 24 positions shown; findings below may reference images not displayed]

ACR Breast Density Category b: There are scattered areas of
fibroglandular density.
FINDINGS: There are no findings suspicious for malignancy.
IMPRESSION: No mammographic evidence of malignancy. A result letter of this
screening mammogram will be mailed directly to the patient.

RECOMMENDATION:
Screening mammogram in one year. (Code:51-O-LD2)

BI-RADS CATEGORY  1: Negative.

## 2023-03-04 ENCOUNTER — Other Ambulatory Visit: Payer: Self-pay | Admitting: Nurse Practitioner

## 2023-03-04 DIAGNOSIS — Z1231 Encounter for screening mammogram for malignant neoplasm of breast: Secondary | ICD-10-CM

## 2023-04-29 ENCOUNTER — Ambulatory Visit
Admission: RE | Admit: 2023-04-29 | Discharge: 2023-04-29 | Disposition: A | Discharge status: Home / Self Care | Payer: Medicare HMO | Source: Ambulatory Visit | Attending: Nurse Practitioner | Admitting: Nurse Practitioner

## 2023-04-29 DIAGNOSIS — Z1231 Encounter for screening mammogram for malignant neoplasm of breast: Secondary | ICD-10-CM | POA: Diagnosis not present

## 2023-11-04 DIAGNOSIS — H5213 Myopia, bilateral: Secondary | ICD-10-CM | POA: Diagnosis not present

## 2023-11-24 DIAGNOSIS — H25813 Combined forms of age-related cataract, bilateral: Secondary | ICD-10-CM | POA: Diagnosis not present

## 2023-11-24 DIAGNOSIS — H35372 Puckering of macula, left eye: Secondary | ICD-10-CM | POA: Diagnosis not present

## 2023-12-05 DIAGNOSIS — I10 Essential (primary) hypertension: Secondary | ICD-10-CM | POA: Diagnosis not present

## 2023-12-05 DIAGNOSIS — E039 Hypothyroidism, unspecified: Secondary | ICD-10-CM | POA: Diagnosis not present

## 2023-12-05 DIAGNOSIS — H25812 Combined forms of age-related cataract, left eye: Secondary | ICD-10-CM | POA: Diagnosis not present

## 2023-12-19 DIAGNOSIS — H2511 Age-related nuclear cataract, right eye: Secondary | ICD-10-CM | POA: Diagnosis not present

## 2023-12-19 DIAGNOSIS — H25811 Combined forms of age-related cataract, right eye: Secondary | ICD-10-CM | POA: Diagnosis not present

## 2023-12-19 DIAGNOSIS — I10 Essential (primary) hypertension: Secondary | ICD-10-CM | POA: Diagnosis not present

## 2023-12-19 DIAGNOSIS — E039 Hypothyroidism, unspecified: Secondary | ICD-10-CM | POA: Diagnosis not present

## 2024-02-16 DIAGNOSIS — E039 Hypothyroidism, unspecified: Secondary | ICD-10-CM | POA: Diagnosis not present

## 2024-02-23 DIAGNOSIS — E042 Nontoxic multinodular goiter: Secondary | ICD-10-CM | POA: Diagnosis not present

## 2024-02-23 DIAGNOSIS — E039 Hypothyroidism, unspecified: Secondary | ICD-10-CM | POA: Diagnosis not present

## 2024-03-16 ENCOUNTER — Other Ambulatory Visit: Payer: Self-pay | Admitting: Nurse Practitioner

## 2024-03-16 DIAGNOSIS — K219 Gastro-esophageal reflux disease without esophagitis: Secondary | ICD-10-CM | POA: Diagnosis not present

## 2024-03-16 DIAGNOSIS — E039 Hypothyroidism, unspecified: Secondary | ICD-10-CM | POA: Diagnosis not present

## 2024-03-16 DIAGNOSIS — Z79899 Other long term (current) drug therapy: Secondary | ICD-10-CM | POA: Diagnosis not present

## 2024-03-16 DIAGNOSIS — Z0000 Encounter for general adult medical examination without abnormal findings: Secondary | ICD-10-CM | POA: Diagnosis not present

## 2024-03-16 DIAGNOSIS — Z1231 Encounter for screening mammogram for malignant neoplasm of breast: Secondary | ICD-10-CM

## 2024-03-16 DIAGNOSIS — I2510 Atherosclerotic heart disease of native coronary artery without angina pectoris: Secondary | ICD-10-CM | POA: Diagnosis not present

## 2024-03-16 DIAGNOSIS — R7302 Impaired glucose tolerance (oral): Secondary | ICD-10-CM | POA: Diagnosis not present

## 2024-03-16 DIAGNOSIS — E559 Vitamin D deficiency, unspecified: Secondary | ICD-10-CM | POA: Diagnosis not present

## 2024-03-16 DIAGNOSIS — E785 Hyperlipidemia, unspecified: Secondary | ICD-10-CM | POA: Diagnosis not present

## 2024-03-16 DIAGNOSIS — I10 Essential (primary) hypertension: Secondary | ICD-10-CM | POA: Diagnosis not present

## 2024-03-16 DIAGNOSIS — F3341 Major depressive disorder, recurrent, in partial remission: Secondary | ICD-10-CM | POA: Diagnosis not present

## 2024-03-26 DIAGNOSIS — D509 Iron deficiency anemia, unspecified: Secondary | ICD-10-CM | POA: Diagnosis not present

## 2024-03-31 DIAGNOSIS — M8588 Other specified disorders of bone density and structure, other site: Secondary | ICD-10-CM | POA: Diagnosis not present

## 2024-04-06 ENCOUNTER — Encounter: Payer: Self-pay | Admitting: Oncology

## 2024-04-06 ENCOUNTER — Inpatient Hospital Stay: Attending: Oncology | Admitting: Oncology

## 2024-04-06 VITALS — BP 113/71 | HR 102 | Temp 98.3°F | Resp 18 | Wt 186.6 lb

## 2024-04-06 DIAGNOSIS — Z90710 Acquired absence of both cervix and uterus: Secondary | ICD-10-CM | POA: Insufficient documentation

## 2024-04-06 DIAGNOSIS — D509 Iron deficiency anemia, unspecified: Secondary | ICD-10-CM | POA: Diagnosis present

## 2024-04-06 DIAGNOSIS — Z7982 Long term (current) use of aspirin: Secondary | ICD-10-CM | POA: Diagnosis not present

## 2024-04-06 DIAGNOSIS — Z79899 Other long term (current) drug therapy: Secondary | ICD-10-CM | POA: Diagnosis not present

## 2024-04-06 NOTE — Addendum Note (Signed)
 Addended by: Timmy Forbes on: 04/06/2024 11:00 PM   Modules accepted: Orders

## 2024-04-06 NOTE — Progress Notes (Signed)
 Hematology/Oncology Consult note [phone] [phone]        REFERRING PROVIDER: Deliah Fells, *   CHIEF COMPLAINTS/REASON FOR VISIT:  Evaluation of iron deficiency anemia.    ASSESSMENT & PLAN:   Iron deficiency anemia Lab results are consistent with iron deficiency anemia.  I discussed about option of IV Venofer treatments. I discussed about the potential risks including but not limited to allergic reactions/infusion reactions including anaphylactic reactions, diarrhea, phlebitis, high blood pressure, wheezing, SOB, skin rash, weight gain,dark urine, leg swelling, back pain, headache, nausea and fatigue, etc. Patient agree with plan of IV venofer.  Recommend IV venofer weekly x 5  Suspect GI bleeding recommend patient to get GI evaluation.    Orders Placed This Encounter  Procedures   CBC with Differential (Cancer Center Only)    Standing Status:   Future    Expected Date:   06/01/2024    Expiration Date:   04/06/2025   Iron and TIBC    Standing Status:   Future    Expected Date:   06/01/2024    Expiration Date:   04/06/2025   Ferritin    Standing Status:   Future    Expected Date:   06/01/2024    Expiration Date:   04/06/2025   Retic Panel    Standing Status:   Future    Expected Date:   06/01/2024    Expiration Date:   04/06/2025   Follow up in 2 months.  All questions were answered. The patient knows to call the clinic with any problems, questions or concerns.  Timmy Forbes, MD, PhD [facility] Hematology Oncology 04/06/2024   HISTORY OF PRESENTING ILLNESS:   Bridget Williams is a  68 y.o.  female with PMH listed below was seen in consultation at the request of  Gauger, Asenath Blacker, *  for evaluation of iron deficiency anemia.   03/16/2024 cbc showed hemoglobin of 7.3, MCV 57.5 03/26/2024 iron panel showed ferritin 14, TIBC 11, iron saturation 11 She has iron deficiency anemia and has not completed the fecal occult blood test she recently  obtained. Her last colonoscopy in 2015 was incomplete due to a hernia obstructing the passage. She has a history of incarcerated hernia.  She is currently taking 81 mg of aspirin  daily and occasionally uses over-the-counter NSAIDS. No current stomach pain is reported, but she has a history of acid reflux, which is well controlled. She has not undergone gastric bypass surgery.  In 2007, she received iron infusions prior to a hysterectomy due to heavy menstrual bleeding and tolerated the infusions well. She currently feels tired, dizzy, and drained, and has been craving ice     MEDICAL HISTORY:  Past Medical History:  Diagnosis Date   GERD (gastroesophageal reflux disease)    Large bowel obstruction (HCC)    Obesity    Thyroid  disease    Ventral hernia     SURGICAL HISTORY: Past Surgical History:  Procedure Laterality Date   ABDOMINAL HYSTERECTOMY     BREAST EXCISIONAL BIOPSY Bilateral 1978   INGUINAL HERNIA REPAIR  01/05/2016   Procedure: HERNIA REPAIR INGUINAL INCARCERATED;  Surgeon: Claudia Cuff, MD;  Location: [facility] ORS;  Service: General;;   LAPAROTOMY N/A 01/05/2016   Procedure: EXPLORATORY LAPAROTOMY;  Surgeon: Claudia Cuff, MD;  Location: [facility] ORS;  Service: General;  Laterality: N/A;   VENTRAL HERNIA REPAIR N/A 05/20/2022   Procedure: INCARCERATED VENTRAL HERNIA REPAIR;  Surgeon: Eldred Grego, MD;  Location: [facility] ORS;  Service: General;  Laterality: N/A;    SOCIAL HISTORY: Social History   Socioeconomic History   Marital status: Widowed    Spouse name: Not on file   Number of children: Not on file   Years of education: Not on file   Highest education level: Not on file  Occupational History   Not on file  Tobacco Use   Smoking status: Never   Smokeless tobacco: Never  Substance and Sexual Activity   Alcohol use: No    Alcohol/week: 0.0 standard drinks of alcohol   Drug use: No   Sexual activity: Not on file  Other Topics Concern   Not on file   Social History Narrative   Not on file   Social Drivers of Health   Financial Resource Strain: Medium Risk (03/16/2024)   Received from [facility]   Overall Financial Resource Strain (CARDIA)    Difficulty of Paying Living Expenses: Somewhat hard  Food Insecurity: No Food Insecurity (04/06/2024)   Hunger Vital Sign    Worried About Running Out of Food in the Last Year: Never true    Ran Out of Food in the Last Year: Never true  Recent Concern: Food Insecurity - Food Insecurity Present (03/16/2024)   Received from [facility]   Hunger Vital Sign    Worried About Running Out of Food in the Last Year: Sometimes true    Ran Out of Food in the Last Year: Often true  Transportation Needs: No Transportation Needs (03/16/2024)   Received from [facility] - Transportation    In the past 12 months, has lack of transportation kept you from medical appointments or from getting medications?: No    Lack of Transportation (Non-Medical): No  Physical Activity: Unknown (12/16/2017)   Received from [facility], [facility]   Exercise Vital Sign    Days of Exercise per Week: 0 days    Minutes of Exercise per Session: Not on file  Stress: Not on file  Social Connections: Moderately Isolated (12/16/2017)   Received from [facility], [facility]   Social Connection and Isolation Panel [NHANES]    Frequency of Communication with Friends and Family: More than three times a week    Frequency of Social Gatherings with Friends and Family: Once a week    Attends Religious Services: Never    Database administrator or Organizations: No    Attends Engineer, structural: Never    Marital Status: Married  Catering manager Violence: Not on file    FAMILY HISTORY: Family History  Problem Relation Age of Onset   Breast cancer Mother 57   Breast cancer Paternal Aunt      ALLERGIES:  has no known allergies.  MEDICATIONS:  Current Outpatient Medications  Medication Sig Dispense Refill   acetaminophen  (TYLENOL ) 325 MG tablet Take 2 tablets (650 mg total) by mouth every 6 (six) hours as needed for mild pain (or Fever >/= 101).     aspirin  EC 81 MG tablet Take 81 mg by mouth daily.     DULoxetine  (CYMBALTA ) 60 MG capsule Take 1 capsule by mouth every morning.     levothyroxine  (SYNTHROID ) 137 MCG tablet Take 100 mcg by mouth daily before breakfast.     lisinopril (ZESTRIL) 5 MG tablet Take 5 mg by mouth daily.     Multiple Vitamin (MULTIVITAMIN WITH MINERALS) TABS tablet Take 1 tablet by  mouth daily.     pantoprazole  (PROTONIX ) 40 MG tablet Take 40 mg by mouth daily.     rosuvastatin (CRESTOR) 5 MG tablet Take 5 mg by mouth.     sertraline  (ZOLOFT ) 100 MG tablet Take 200 mg by mouth daily.     traZODone  (DESYREL ) 100 MG tablet Take 100 mg by mouth at bedtime as needed.     Vitamin D , Ergocalciferol , (DRISDOL ) 50000 units CAPS capsule Take 50,000 Units by mouth every 30 (thirty) days. Pt takes on the 1st of every month.     hydrOXYzine (VISTARIL) 25 MG capsule Take 1 capsule by mouth daily as needed. (Patient not taking: Reported on 04/06/2024)     polyethylene glycol (MIRALAX  / GLYCOLAX ) 17 g packet Take 17 g by mouth 2 (two) times daily. (Patient not taking: Reported on 04/06/2024) 14 each 0   No current facility-administered medications for this visit.    Review of Systems  Constitutional:  Positive for fatigue. Negative for appetite change, chills and fever.  HENT:   Negative for hearing loss and voice change.   Eyes:  Negative for eye problems.  Respiratory:  Negative for chest tightness and cough.   Cardiovascular:  Negative for chest pain.  Gastrointestinal:  Negative for abdominal distention, abdominal pain, blood in stool, nausea and vomiting.  Endocrine: Negative for hot flashes.  Genitourinary:  Negative for difficulty urinating and frequency.    Musculoskeletal:  Negative for arthralgias.  Skin:  Negative for itching and rash.  Neurological:  Negative for extremity weakness.  Hematological:  Negative for adenopathy.  Psychiatric/Behavioral:  Negative for confusion.    PHYSICAL EXAMINATION: ECOG PERFORMANCE STATUS: 1 - Symptomatic but completely ambulatory Vitals:   04/06/24 1118  BP: 113/71  Pulse: (!) 102  Resp: 18  Temp: 98.3 F (36.8 C)  SpO2: 97%   Filed Weights   04/06/24 1118  Weight: 186 lb 9.6 oz (84.6 kg)    Physical Exam Constitutional:      General: She is not in acute distress. HENT:     Head: Normocephalic and atraumatic.  Eyes:     General: No scleral icterus. Cardiovascular:     Rate and Rhythm: Normal rate and regular rhythm.     Heart sounds: Normal heart sounds.  Pulmonary:     Effort: Pulmonary effort is normal. No respiratory distress.     Breath sounds: Normal breath sounds. No wheezing.  Abdominal:     General: Bowel sounds are normal. There is no distension.     Palpations: Abdomen is soft.  Musculoskeletal:        General: No deformity. Normal range of motion.     Cervical back: Normal range of motion and neck supple.  Skin:    General: Skin is warm and dry.     Findings: No erythema or rash.  Neurological:     Mental Status: She is alert and oriented to person, place, and time. Mental status is at baseline.  Psychiatric:        Mood and Affect: Mood normal.     LABORATORY DATA:  I have reviewed the data as listed    Latest Ref Rng & Units 05/24/2022    4:50 AM 05/23/2022    9:42 AM 05/22/2022    8:46 AM  CBC  WBC 4.0 - 10.5 K/uL 14.4  17.7  20.5   Hemoglobin 12.0 - 15.0 g/dL 29.5  62.1  30.8   Hematocrit 36.0 - 46.0 % 32.8  33.8  37.4  Platelets 150 - 400 K/uL 370  405  468       Latest Ref Rng & Units 05/21/2022    4:41 AM 05/20/2022    1:31 PM 01/07/2016    8:43 AM  CMP  Glucose 70 - 99 mg/dL 409  811  914   BUN 8 - 23 mg/dL 13  12  11    Creatinine 0.44 - 1.00  mg/dL 7.82  9.56  2.13   Sodium 135 - 145 mmol/L 140  143  140   Potassium 3.5 - 5.1 mmol/L 4.1  4.3  3.9   Chloride 98 - 111 mmol/L 108  103  106   CO2 22 - 32 mmol/L 23  29  26    Calcium 8.9 - 10.3 mg/dL 8.2  9.6  8.4   Total Protein 6.5 - 8.1 g/dL  7.8    Total Bilirubin 0.3 - 1.2 mg/dL  1.1    Alkaline Phos 38 - 126 U/L  104    AST 15 - 41 U/L  24    ALT 0 - 44 U/L  24        RADIOGRAPHIC STUDIES: I have personally reviewed the radiological images as listed and agreed with the findings in the report. No results found.

## 2024-04-06 NOTE — Assessment & Plan Note (Addendum)
 Lab results are consistent with iron deficiency anemia.  I discussed about option of IV Venofer treatments. I discussed about the potential risks including but not limited to allergic reactions/infusion reactions including anaphylactic reactions, diarrhea, phlebitis, high blood pressure, wheezing, SOB, skin rash, weight gain,dark urine, leg swelling, back pain, headache, nausea and fatigue, etc. Patient agree with plan of IV venofer.  Recommend IV venofer weekly x 5  Suspect GI bleeding recommend patient to get GI evaluation.

## 2024-04-06 NOTE — Progress Notes (Signed)
 Patient has been having fatigue, weakness, shortness of breath, and some dizziness, which she says that since she has been on the iron pills she isn't feeling as dizzy as often. She has had iron infusions in the past and they did her a lot of good.

## 2024-04-08 ENCOUNTER — Inpatient Hospital Stay

## 2024-04-08 VITALS — BP 132/71 | HR 89 | Temp 97.2°F | Resp 18

## 2024-04-08 DIAGNOSIS — Z90710 Acquired absence of both cervix and uterus: Secondary | ICD-10-CM | POA: Diagnosis not present

## 2024-04-08 DIAGNOSIS — D509 Iron deficiency anemia, unspecified: Secondary | ICD-10-CM

## 2024-04-08 DIAGNOSIS — Z79899 Other long term (current) drug therapy: Secondary | ICD-10-CM | POA: Diagnosis not present

## 2024-04-08 DIAGNOSIS — Z7982 Long term (current) use of aspirin: Secondary | ICD-10-CM | POA: Diagnosis not present

## 2024-04-08 MED ORDER — IRON SUCROSE 20 MG/ML IV SOLN
200.0000 mg | Freq: Once | INTRAVENOUS | Status: AC
  Administered 2024-04-08: 200 mg via INTRAVENOUS

## 2024-04-08 NOTE — Patient Instructions (Signed)

## 2024-04-13 ENCOUNTER — Inpatient Hospital Stay

## 2024-04-13 VITALS — BP 144/77 | HR 78 | Temp 97.1°F | Resp 18

## 2024-04-13 DIAGNOSIS — Z7982 Long term (current) use of aspirin: Secondary | ICD-10-CM | POA: Diagnosis not present

## 2024-04-13 DIAGNOSIS — D509 Iron deficiency anemia, unspecified: Secondary | ICD-10-CM | POA: Diagnosis not present

## 2024-04-13 DIAGNOSIS — Z90710 Acquired absence of both cervix and uterus: Secondary | ICD-10-CM | POA: Diagnosis not present

## 2024-04-13 DIAGNOSIS — Z79899 Other long term (current) drug therapy: Secondary | ICD-10-CM | POA: Diagnosis not present

## 2024-04-13 MED ORDER — IRON SUCROSE 20 MG/ML IV SOLN
200.0000 mg | Freq: Once | INTRAVENOUS | Status: AC
  Administered 2024-04-13: 200 mg via INTRAVENOUS

## 2024-04-13 NOTE — Patient Instructions (Signed)

## 2024-04-20 ENCOUNTER — Inpatient Hospital Stay

## 2024-04-20 VITALS — BP 131/84 | HR 82 | Temp 97.2°F | Resp 18

## 2024-04-20 DIAGNOSIS — Z90710 Acquired absence of both cervix and uterus: Secondary | ICD-10-CM | POA: Diagnosis not present

## 2024-04-20 DIAGNOSIS — D509 Iron deficiency anemia, unspecified: Secondary | ICD-10-CM

## 2024-04-20 DIAGNOSIS — Z7982 Long term (current) use of aspirin: Secondary | ICD-10-CM | POA: Diagnosis not present

## 2024-04-20 DIAGNOSIS — Z79899 Other long term (current) drug therapy: Secondary | ICD-10-CM | POA: Diagnosis not present

## 2024-04-20 MED ORDER — IRON SUCROSE 20 MG/ML IV SOLN
200.0000 mg | Freq: Once | INTRAVENOUS | Status: AC
  Administered 2024-04-20: 200 mg via INTRAVENOUS

## 2024-04-20 NOTE — Patient Instructions (Signed)

## 2024-04-27 ENCOUNTER — Inpatient Hospital Stay: Attending: Oncology

## 2024-04-27 VITALS — BP 139/76 | HR 99 | Temp 97.4°F | Resp 18

## 2024-04-27 DIAGNOSIS — D509 Iron deficiency anemia, unspecified: Secondary | ICD-10-CM | POA: Insufficient documentation

## 2024-04-27 MED ORDER — IRON SUCROSE 20 MG/ML IV SOLN
200.0000 mg | Freq: Once | INTRAVENOUS | Status: AC
  Administered 2024-04-27: 200 mg via INTRAVENOUS
  Filled 2024-04-27: qty 10

## 2024-04-28 DIAGNOSIS — D509 Iron deficiency anemia, unspecified: Secondary | ICD-10-CM | POA: Diagnosis not present

## 2024-05-04 ENCOUNTER — Inpatient Hospital Stay

## 2024-05-04 VITALS — BP 141/90 | HR 86 | Temp 96.0°F | Resp 17

## 2024-05-04 DIAGNOSIS — D509 Iron deficiency anemia, unspecified: Secondary | ICD-10-CM | POA: Diagnosis not present

## 2024-05-04 MED ORDER — IRON SUCROSE 20 MG/ML IV SOLN
200.0000 mg | Freq: Once | INTRAVENOUS | Status: AC
  Administered 2024-05-04: 200 mg via INTRAVENOUS
  Filled 2024-05-04: qty 10

## 2024-05-04 MED ORDER — SODIUM CHLORIDE 0.9% FLUSH
10.0000 mL | Freq: Once | INTRAVENOUS | Status: AC | PRN
  Administered 2024-05-04: 10 mL
  Filled 2024-05-04: qty 10

## 2024-05-04 NOTE — Patient Instructions (Signed)

## 2024-05-04 NOTE — Progress Notes (Signed)
Patient tolerated Venofer infusion well. Explained recommendation of 30 min post monitoring. Patient refused to wait post monitoring. Educated on what signs to watch for & to call with any concerns. No questions, discharged. Stable  

## 2024-05-28 ENCOUNTER — Inpatient Hospital Stay: Attending: Oncology

## 2024-05-28 DIAGNOSIS — Z79899 Other long term (current) drug therapy: Secondary | ICD-10-CM | POA: Insufficient documentation

## 2024-05-28 DIAGNOSIS — Z803 Family history of malignant neoplasm of breast: Secondary | ICD-10-CM | POA: Insufficient documentation

## 2024-05-28 DIAGNOSIS — Z79890 Hormone replacement therapy (postmenopausal): Secondary | ICD-10-CM | POA: Insufficient documentation

## 2024-05-28 DIAGNOSIS — Z7982 Long term (current) use of aspirin: Secondary | ICD-10-CM | POA: Insufficient documentation

## 2024-05-28 DIAGNOSIS — D509 Iron deficiency anemia, unspecified: Secondary | ICD-10-CM | POA: Insufficient documentation

## 2024-05-28 LAB — CBC WITH DIFFERENTIAL (CANCER CENTER ONLY)
Abs Immature Granulocytes: 0.01 K/uL (ref 0.00–0.07)
Basophils Absolute: 0 K/uL (ref 0.0–0.1)
Basophils Relative: 0 %
Eosinophils Absolute: 0.2 K/uL (ref 0.0–0.5)
Eosinophils Relative: 4 %
HCT: 40.6 % (ref 36.0–46.0)
Hemoglobin: 12.1 g/dL (ref 12.0–15.0)
Immature Granulocytes: 0 %
Lymphocytes Relative: 17 %
Lymphs Abs: 1.1 K/uL (ref 0.7–4.0)
MCH: 23.7 pg — ABNORMAL LOW (ref 26.0–34.0)
MCHC: 29.8 g/dL — ABNORMAL LOW (ref 30.0–36.0)
MCV: 79.5 fL — ABNORMAL LOW (ref 80.0–100.0)
Monocytes Absolute: 0.4 K/uL (ref 0.1–1.0)
Monocytes Relative: 6 %
Neutro Abs: 4.9 K/uL (ref 1.7–7.7)
Neutrophils Relative %: 73 %
Platelet Count: 180 K/uL (ref 150–400)
RBC: 5.11 MIL/uL (ref 3.87–5.11)
RDW: 26.4 % — ABNORMAL HIGH (ref 11.5–15.5)
WBC Count: 6.6 K/uL (ref 4.0–10.5)
nRBC: 0 % (ref 0.0–0.2)

## 2024-05-28 LAB — IRON AND TIBC
Iron: 39 ug/dL (ref 28–170)
Saturation Ratios: 13 % (ref 10.4–31.8)
TIBC: 300 ug/dL (ref 250–450)
UIBC: 261 ug/dL

## 2024-05-28 LAB — RETIC PANEL
Immature Retic Fract: 10.2 % (ref 2.3–15.9)
RBC.: 5.02 MIL/uL (ref 3.87–5.11)
Retic Count, Absolute: 51.2 K/uL (ref 19.0–186.0)
Retic Ct Pct: 1 % (ref 0.4–3.1)
Reticulocyte Hemoglobin: 29.4 pg (ref >27.9)

## 2024-05-28 LAB — FERRITIN: Ferritin: 51 ng/mL (ref 11–307)

## 2024-05-31 DIAGNOSIS — D509 Iron deficiency anemia, unspecified: Secondary | ICD-10-CM | POA: Diagnosis not present

## 2024-06-01 ENCOUNTER — Inpatient Hospital Stay

## 2024-06-01 ENCOUNTER — Encounter: Payer: Self-pay | Admitting: Oncology

## 2024-06-01 ENCOUNTER — Inpatient Hospital Stay (HOSPITAL_BASED_OUTPATIENT_CLINIC_OR_DEPARTMENT_OTHER): Admitting: Oncology

## 2024-06-01 VITALS — BP 145/80 | HR 97 | Temp 98.1°F | Resp 18 | Wt 189.2 lb

## 2024-06-01 DIAGNOSIS — Z79890 Hormone replacement therapy (postmenopausal): Secondary | ICD-10-CM | POA: Diagnosis not present

## 2024-06-01 DIAGNOSIS — D509 Iron deficiency anemia, unspecified: Secondary | ICD-10-CM

## 2024-06-01 DIAGNOSIS — Z79899 Other long term (current) drug therapy: Secondary | ICD-10-CM | POA: Diagnosis not present

## 2024-06-01 DIAGNOSIS — Z7982 Long term (current) use of aspirin: Secondary | ICD-10-CM | POA: Diagnosis not present

## 2024-06-01 DIAGNOSIS — Z803 Family history of malignant neoplasm of breast: Secondary | ICD-10-CM | POA: Diagnosis not present

## 2024-06-01 MED ORDER — IRON SUCROSE 20 MG/ML IV SOLN
200.0000 mg | Freq: Once | INTRAVENOUS | Status: AC
  Administered 2024-06-01: 200 mg via INTRAVENOUS
  Filled 2024-06-01: qty 10

## 2024-06-01 NOTE — Assessment & Plan Note (Addendum)
 Lab results are consistent with iron  deficiency anemia.  Lab Results  Component Value Date   HGB 12.1 05/28/2024   TIBC 300 05/28/2024   IRONPCTSAT 13 05/28/2024   FERRITIN 51 05/28/2024    Both hemoglobin and iron  panel have improved. She has borderline iron  saturation.  Recommend 1 additional IV Venofer  200 mg x 1 today to further improve iron  stores.  Suspect GI bleeding recommend patient to get GI evaluation.  She is scheduled to have endoscopy evaluation on 06/10/2024.

## 2024-06-01 NOTE — Progress Notes (Signed)
 Hematology/Oncology Consult note [phone] [phone]        REFERRING PROVIDER: Don Lauraine Williams, *   CHIEF COMPLAINTS/REASON FOR VISIT:  Evaluation of iron  deficiency anemia.    ASSESSMENT & PLAN:   Iron  deficiency anemia Lab results are consistent with iron  deficiency anemia.  Lab Results  Component Value Date   HGB 12.1 05/28/2024   TIBC 300 05/28/2024   IRONPCTSAT 13 05/28/2024   FERRITIN 51 05/28/2024    Both hemoglobin and iron  panel have improved. She has borderline iron  saturation.  Recommend 1 additional IV Venofer  200 mg x 1 today to further improve iron  stores.  Suspect GI bleeding recommend patient to get GI evaluation.    Orders Placed This Encounter  Procedures   CBC with Differential (Cancer Center Only)    Standing Status:   Future    Expected Date:   10/01/2024    Expiration Date:   12/30/2024   Iron  and TIBC    Standing Status:   Future    Expected Date:   10/01/2024    Expiration Date:   12/30/2024   Ferritin    Standing Status:   Future    Expected Date:   10/01/2024    Expiration Date:   12/30/2024   Retic Panel    Standing Status:   Future    Expected Date:   10/01/2024    Expiration Date:   12/30/2024   Follow up in 2 months.  All questions were answered. The patient knows to call the clinic with any problems, questions or concerns.  Zelphia Cap, MD, PhD [facility] Hematology Oncology 06/01/2024   HISTORY OF PRESENTING ILLNESS:   Bridget Williams is a  68 y.o.  female with PMH listed below was seen in consultation at the request of  Bridget Williams, *  for evaluation of iron  deficiency anemia.   03/16/2024 cbc showed hemoglobin of 7.3, MCV 57.5 03/26/2024 iron  panel showed ferritin 14, TIBC 11, iron  saturation 11 She has iron  deficiency anemia and has not completed the fecal occult blood test she recently obtained. Her last colonoscopy in 2015 was incomplete due to a hernia obstructing the passage. She has a  history of incarcerated hernia.  She is currently taking 81 mg of aspirin  daily and occasionally uses over-the-counter NSAIDS. No current stomach pain is reported, but she has a history of acid reflux, which is well controlled. She has not undergone gastric bypass surgery.  In 2007, she received iron  infusions prior to a hysterectomy due to heavy menstrual bleeding and tolerated the infusions well. She currently feels tired, dizzy, and drained, and has been craving ice  INTERVAL HISTORY Bridget Williams is a 68 y.o. female who has above history reviewed by me today presents for follow up visit for Iron  deficiency anemia Patient tolerated IV Venofer  treatments.  Some improvement of chronic fatigue.  She has no new complaints.  MEDICAL HISTORY:  Past Medical History:  Diagnosis Date   GERD (gastroesophageal reflux disease)    Large bowel obstruction (HCC)    Obesity    Thyroid  disease    Ventral hernia     SURGICAL HISTORY: Past Surgical History:  Procedure Laterality Date   ABDOMINAL HYSTERECTOMY     BREAST EXCISIONAL BIOPSY Bilateral 1978   INGUINAL HERNIA REPAIR  01/05/2016   Procedure: HERNIA REPAIR INGUINAL INCARCERATED;  Surgeon: Charlie FORBES Fell, MD;  Location: [facility] ORS;  Service: General;;   LAPAROTOMY N/A 01/05/2016   Procedure: EXPLORATORY LAPAROTOMY;  Surgeon: Charlie FORBES Fell, MD;  Location: [facility] ORS;  Service: General;  Laterality: N/A;   VENTRAL HERNIA REPAIR N/A 05/20/2022   Procedure: INCARCERATED VENTRAL HERNIA REPAIR;  Surgeon: Rodolph Romano, MD;  Location: [facility] ORS;  Service: General;  Laterality: N/A;    SOCIAL HISTORY: Social History   Socioeconomic History   Marital status: Widowed    Spouse name: Not on file   Number of children: Not on file   Years of education: Not on file   Highest education level: Not on file  Occupational History   Not on file  Tobacco Use   Smoking status: Never   Smokeless tobacco: Never  Substance and Sexual Activity    Alcohol use: No    Alcohol/week: 0.0 standard drinks of alcohol   Drug use: No   Sexual activity: Not on file  Other Topics Concern   Not on file  Social History Narrative   Not on file   Social Drivers of Health   Financial Resource Strain: Medium Risk (03/16/2024)   Received from [facility]   Overall Financial Resource Strain (CARDIA)    Difficulty of Paying Living Expenses: Somewhat hard  Food Insecurity: No Food Insecurity (04/06/2024)   Hunger Vital Sign    Worried About Running Out of Food in the Last Year: Never true    Ran Out of Food in the Last Year: Never true  Recent Concern: Food Insecurity - Food Insecurity Present (03/16/2024)   Received from [facility]   Hunger Vital Sign    Within the past 12 months, you worried that your food would run out before you got the money to buy more.: Sometimes true    Within the past 12 months, the food you bought just didn't last and you didn't have money to get more.: Often true  Transportation Needs: No Transportation Needs (03/16/2024)   Received from [facility] Transportation    In the past 12 months, has lack of transportation kept you from medical appointments or from getting medications?: No    Lack of Transportation (Non-Medical): No  Physical Activity: Unknown (12/16/2017)   Received from [facility]   Exercise Vital Sign    Days of Exercise per Week: 0 days    Minutes of Exercise per Session: Not on file  Stress: Not on file  Social Connections: Moderately Isolated (12/16/2017)   Received from [facility]   Social Connection and Isolation Panel    Frequency of Communication with Friends and Family: More than three times a week    Frequency of Social Gatherings with Friends and Family: Once a week    Attends Religious Services: Never    Database administrator or Organizations: No    Attends Hospital doctor: Never    Marital Status: Married  Catering manager Violence: Not on file    FAMILY HISTORY: Family History  Problem Relation Age of Onset   Breast cancer Mother 22   Breast cancer Paternal Aunt     ALLERGIES:  has no known allergies.  MEDICATIONS:  Current Outpatient Medications  Medication Sig Dispense Refill   acetaminophen  (TYLENOL ) 325 MG tablet Take 2 tablets (650 mg total) by mouth every 6 (six) hours as needed for mild pain (or Fever >/= 101).     aspirin  EC 81 MG tablet Take 81 mg by mouth daily.     DULoxetine  (CYMBALTA ) 60 MG capsule Take  1 capsule by mouth every morning.     levothyroxine  (SYNTHROID ) 137 MCG tablet Take 100 mcg by mouth daily before breakfast.     lisinopril (ZESTRIL) 5 MG tablet Take 5 mg by mouth daily.     Multiple Vitamin (MULTIVITAMIN WITH MINERALS) TABS tablet Take 1 tablet by mouth daily.     pantoprazole  (PROTONIX ) 40 MG tablet Take 40 mg by mouth daily.     rosuvastatin (CRESTOR) 5 MG tablet Take 5 mg by mouth.     sertraline  (ZOLOFT ) 100 MG tablet Take 200 mg by mouth daily.     traZODone  (DESYREL ) 100 MG tablet Take 100 mg by mouth at bedtime as needed.     Vitamin D , Ergocalciferol , (DRISDOL ) 50000 units CAPS capsule Take 50,000 Units by mouth every 30 (thirty) days. Pt takes on the 1st of every month.     hydrOXYzine (VISTARIL) 25 MG capsule Take 1 capsule by mouth daily as needed. (Patient not taking: Reported on 06/01/2024)     polyethylene glycol (MIRALAX  / GLYCOLAX ) 17 g packet Take 17 g by mouth 2 (two) times daily. (Patient not taking: Reported on 06/01/2024) 14 each 0   No current facility-administered medications for this visit.    Review of Systems  Constitutional:  Positive for fatigue. Negative for appetite change, chills and fever.  HENT:   Negative for hearing loss and voice change.   Eyes:  Negative for eye problems.  Respiratory:  Negative for chest tightness and cough.   Cardiovascular:  Negative for chest pain.   Gastrointestinal:  Negative for abdominal distention, abdominal pain, blood in stool, nausea and vomiting.  Endocrine: Negative for hot flashes.  Genitourinary:  Negative for difficulty urinating and frequency.   Musculoskeletal:  Negative for arthralgias.  Skin:  Negative for itching and rash.  Neurological:  Negative for extremity weakness.  Hematological:  Negative for adenopathy.  Psychiatric/Behavioral:  Negative for confusion.    PHYSICAL EXAMINATION: ECOG PERFORMANCE STATUS: 1 - Symptomatic but completely ambulatory Vitals:   06/01/24 1036  BP: (!) 145/80  Pulse: 97  Resp: 18  Temp: 98.1 F (36.7 C)  SpO2: 98%   Filed Weights   06/01/24 1036  Weight: 189 lb 3.2 oz (85.8 kg)    Physical Exam Constitutional:      General: She is not in acute distress. HENT:     Head: Normocephalic and atraumatic.  Eyes:     General: No scleral icterus. Cardiovascular:     Rate and Rhythm: Normal rate and regular rhythm.     Heart sounds: Normal heart sounds.  Pulmonary:     Effort: Pulmonary effort is normal. No respiratory distress.     Breath sounds: Normal breath sounds. No wheezing.  Abdominal:     General: Bowel sounds are normal. There is no distension.     Palpations: Abdomen is soft.  Musculoskeletal:        General: No deformity. Normal range of motion.     Cervical back: Normal range of motion and neck supple.  Skin:    General: Skin is warm and dry.     Findings: No erythema or rash.  Neurological:     Mental Status: She is alert and oriented to person, place, and time. Mental status is at baseline.  Psychiatric:        Mood and Affect: Mood normal.     LABORATORY DATA:  I have reviewed the data as listed    Latest Ref Rng & Units 05/28/2024  10:56 AM 05/24/2022    4:50 AM 05/23/2022    9:42 AM  CBC  WBC 4.0 - 10.5 K/uL 6.6  14.4  17.7   Hemoglobin 12.0 - 15.0 g/dL 87.8  89.9  89.7   Hematocrit 36.0 - 46.0 % 40.6  32.8  33.8   Platelets 150 - 400 K/uL  180  370  405       Latest Ref Rng & Units 05/21/2022    4:41 AM 05/20/2022    1:31 PM 01/07/2016    8:43 AM  CMP  Glucose 70 - 99 mg/dL 884  888  881   BUN 8 - 23 mg/dL 13  12  11    Creatinine 0.44 - 1.00 mg/dL 9.06  8.89  9.04   Sodium 135 - 145 mmol/L 140  143  140   Potassium 3.5 - 5.1 mmol/L 4.1  4.3  3.9   Chloride 98 - 111 mmol/L 108  103  106   CO2 22 - 32 mmol/L 23  29  26    Calcium 8.9 - 10.3 mg/dL 8.2  9.6  8.4   Total Protein 6.5 - 8.1 g/dL  7.8    Total Bilirubin 0.3 - 1.2 mg/dL  1.1    Alkaline Phos 38 - 126 U/L  104    AST 15 - 41 U/L  24    ALT 0 - 44 U/L  24        RADIOGRAPHIC STUDIES: I have personally reviewed the radiological images as listed and agreed with the findings in the report. No results found.

## 2024-06-09 ENCOUNTER — Encounter: Payer: Self-pay | Admitting: Gastroenterology

## 2024-06-10 ENCOUNTER — Ambulatory Visit
Admission: RE | Admit: 2024-06-10 | Discharge: 2024-06-10 | Disposition: A | Discharge status: Home / Self Care | Attending: Gastroenterology | Admitting: Gastroenterology

## 2024-06-10 ENCOUNTER — Encounter
Admission: RE | Disposition: A | Discharge status: Home / Self Care | Payer: Self-pay | Source: Home / Self Care | Attending: Gastroenterology

## 2024-06-10 ENCOUNTER — Encounter: Payer: Self-pay | Admitting: Gastroenterology

## 2024-06-10 ENCOUNTER — Ambulatory Visit: Admitting: Registered Nurse

## 2024-06-10 ENCOUNTER — Other Ambulatory Visit: Payer: Self-pay

## 2024-06-10 DIAGNOSIS — I10 Essential (primary) hypertension: Secondary | ICD-10-CM | POA: Insufficient documentation

## 2024-06-10 DIAGNOSIS — K5730 Diverticulosis of large intestine without perforation or abscess without bleeding: Secondary | ICD-10-CM | POA: Diagnosis not present

## 2024-06-10 DIAGNOSIS — K219 Gastro-esophageal reflux disease without esophagitis: Secondary | ICD-10-CM | POA: Diagnosis not present

## 2024-06-10 DIAGNOSIS — K3189 Other diseases of stomach and duodenum: Secondary | ICD-10-CM | POA: Diagnosis not present

## 2024-06-10 DIAGNOSIS — K449 Diaphragmatic hernia without obstruction or gangrene: Secondary | ICD-10-CM | POA: Diagnosis not present

## 2024-06-10 DIAGNOSIS — I2510 Atherosclerotic heart disease of native coronary artery without angina pectoris: Secondary | ICD-10-CM | POA: Diagnosis not present

## 2024-06-10 DIAGNOSIS — K640 First degree hemorrhoids: Secondary | ICD-10-CM | POA: Insufficient documentation

## 2024-06-10 DIAGNOSIS — D509 Iron deficiency anemia, unspecified: Secondary | ICD-10-CM | POA: Diagnosis not present

## 2024-06-10 DIAGNOSIS — K649 Unspecified hemorrhoids: Secondary | ICD-10-CM | POA: Diagnosis not present

## 2024-06-10 DIAGNOSIS — Z90710 Acquired absence of both cervix and uterus: Secondary | ICD-10-CM | POA: Insufficient documentation

## 2024-06-10 DIAGNOSIS — Z9889 Other specified postprocedural states: Secondary | ICD-10-CM | POA: Diagnosis not present

## 2024-06-10 DIAGNOSIS — F32A Depression, unspecified: Secondary | ICD-10-CM | POA: Diagnosis not present

## 2024-06-10 DIAGNOSIS — Z79899 Other long term (current) drug therapy: Secondary | ICD-10-CM | POA: Diagnosis not present

## 2024-06-10 DIAGNOSIS — D759 Disease of blood and blood-forming organs, unspecified: Secondary | ICD-10-CM | POA: Diagnosis not present

## 2024-06-10 DIAGNOSIS — K5790 Diverticulosis of intestine, part unspecified, without perforation or abscess without bleeding: Secondary | ICD-10-CM | POA: Diagnosis not present

## 2024-06-10 DIAGNOSIS — K319 Disease of stomach and duodenum, unspecified: Secondary | ICD-10-CM | POA: Diagnosis not present

## 2024-06-10 HISTORY — DX: Essential (primary) hypertension: I10

## 2024-06-10 HISTORY — DX: Hypothyroidism, unspecified: E03.9

## 2024-06-10 HISTORY — PX: ESOPHAGOGASTRODUODENOSCOPY: SHX5428

## 2024-06-10 HISTORY — PX: COLONOSCOPY: SHX5424

## 2024-06-10 SURGERY — COLONOSCOPY
Anesthesia: General

## 2024-06-10 MED ORDER — SODIUM CHLORIDE 0.9 % IV SOLN: INTRAVENOUS | Status: DC

## 2024-06-10 MED ORDER — LIDOCAINE HCL (CARDIAC) PF 100 MG/5ML IV SOSY
PREFILLED_SYRINGE | INTRAVENOUS | Status: DC | PRN
  Administered 2024-06-10: 100 mg via INTRAVENOUS

## 2024-06-10 MED ORDER — PROPOFOL 10 MG/ML IV BOLUS
INTRAVENOUS | Status: DC | PRN
  Administered 2024-06-10: 40 mg via INTRAVENOUS
  Administered 2024-06-10: 80 mg via INTRAVENOUS
  Administered 2024-06-10: 100 ug/kg/min via INTRAVENOUS
  Administered 2024-06-10: 20 mg via INTRAVENOUS
  Administered 2024-06-10: 40 mg via INTRAVENOUS

## 2024-06-10 MED ORDER — DEXMEDETOMIDINE HCL IN NACL 80 MCG/20ML IV SOLN
INTRAVENOUS | Status: DC | PRN
  Administered 2024-06-10: 4 ug via INTRAVENOUS
  Administered 2024-06-10: 8 ug via INTRAVENOUS

## 2024-06-10 NOTE — Interval H&P Note (Signed)
 History and Physical Interval Note: Preprocedure H&P from 06/10/24  was reviewed and there was no interval change after seeing and examining the patient.  Written consent was obtained from the patient after discussion of risks, benefits, and alternatives. Patient has consented to proceed with Esophagogastroduodenoscopy and Colonoscopy with possible intervention   06/10/2024 10:04 AM  Bridget Williams  has presented today for surgery, with the diagnosis of IDA,.  The various methods of treatment have been discussed with the patient and family. After consideration of risks, benefits and other options for treatment, the patient has consented to  Procedure(s): COLONOSCOPY (N/A) EGD (ESOPHAGOGASTRODUODENOSCOPY) (N/A) as a surgical intervention.  The patient's history has been reviewed, patient examined, no change in status, stable for surgery.  I have reviewed the patient's chart and labs.  Questions were answered to the patient's satisfaction.     Elspeth Ozell Jungling

## 2024-06-10 NOTE — Anesthesia Preprocedure Evaluation (Signed)
 Anesthesia Evaluation  Patient identified by MRN, date of birth, ID band Patient awake    Reviewed: Allergy & Precautions, H&P , NPO status , Patient's Chart, lab work & pertinent test results, reviewed documented beta blocker date and time   History of Anesthesia Complications Negative for: history of anesthetic complications  Airway Mallampati: II  TM Distance: >3 FB Neck ROM: full    Dental  (+) Dental Advidsory Given, Caps, Teeth Intact   Pulmonary neg pulmonary ROS   Pulmonary exam normal breath sounds clear to auscultation       Cardiovascular Exercise Tolerance: Good hypertension, (-) angina + CAD  (-) Past MI, (-) Cardiac Stents and (-) CABG Normal cardiovascular exam(-) dysrhythmias (-) Valvular Problems/Murmurs Rhythm:regular Rate:Normal     Neuro/Psych  PSYCHIATRIC DISORDERS  Depression    negative neurological ROS     GI/Hepatic Neg liver ROS,GERD  ,,  Endo/Other  negative endocrine ROS    Renal/GU negative Renal ROS  negative genitourinary   Musculoskeletal   Abdominal   Peds  Hematology  (+) Blood dyscrasia, anemia   Anesthesia Other Findings Past Medical History: No date: GERD (gastroesophageal reflux disease) No date: Large bowel obstruction (HCC) No date: Obesity No date: Thyroid  disease No date: Ventral hernia   Reproductive/Obstetrics negative OB ROS                              Anesthesia Physical Anesthesia Plan  ASA: 2  Anesthesia Plan: General   Post-op Pain Management:    Induction: Intravenous  PONV Risk Score and Plan: 3 and Propofol  infusion, TIVA and Treatment may vary due to age or medical condition  Airway Management Planned: Natural Airway and Nasal Cannula  Additional Equipment:   Intra-op Plan:   Post-operative Plan:   Informed Consent: I have reviewed the patients History and Physical, chart, labs and discussed the procedure including  the risks, benefits and alternatives for the proposed anesthesia with the patient or authorized representative who has indicated his/her understanding and acceptance.     Dental Advisory Given  Plan Discussed with: Anesthesiologist, CRNA and Surgeon  Anesthesia Plan Comments:          Anesthesia Quick Evaluation

## 2024-06-10 NOTE — Op Note (Signed)
 [facility] Patient Name: Bridget Williams Procedure Date: 06/10/2024 10:00 AM MRN: [id number] Account #: [id number] Date of Birth: Jan 11, 1956 Admit Type: Outpatient Age: 68 Room: [facility] ENDO ROOM 1 Gender: Female Note Status: Finalized Instrument Name: Peds Colonoscope [id number] Procedure:             Colonoscopy Indications:           Iron  deficiency anemia Providers:             Elspeth Ozell Jungling DO, DO Referring MD:          Lauraine LOIS Leak (Referring MD) Medicines:             Monitored Anesthesia Care Complications:         No immediate complications. Estimated blood loss: None. Procedure:             Pre-Anesthesia Assessment:                        - Prior to the procedure, a History and Physical was                         performed, and patient medications and allergies were                         reviewed. The patient is competent. The risks and                         benefits of the procedure and the sedation options and                         risks were discussed with the patient. All questions                         were answered and informed consent was obtained.                         Patient identification and proposed procedure were                         verified by the physician, the nurse, the anesthetist                         and the technician in the endoscopy suite. Mental                         Status Examination: alert and oriented. Airway                         Examination: normal oropharyngeal airway and neck                         mobility. Respiratory Examination: clear to                         auscultation. CV Examination: RRR, no murmurs, no S3                         or S4. Prophylactic Antibiotics: The patient does not  require prophylactic antibiotics. Prior                         Anticoagulants: The patient has taken no anticoagulant                         or antiplatelet  agents. ASA Grade Assessment: II - A                         patient with mild systemic disease. After reviewing                         the risks and benefits, the patient was deemed in                         satisfactory condition to undergo the procedure. The                         anesthesia plan was to use monitored anesthesia care                         (MAC). Immediately prior to administration of                         medications, the patient was re-assessed for adequacy                         to receive sedatives. The heart rate, respiratory                         rate, oxygen saturations, blood pressure, adequacy of                         pulmonary ventilation, and response to care were                         monitored throughout the procedure. The physical                         status of the patient was re-assessed after the                         procedure.                        After obtaining informed consent, the colonoscope was                         passed under direct vision. Throughout the procedure,                         the patient's blood pressure, pulse, and oxygen                         saturations were monitored continuously. The                         Colonoscope was introduced through the anus and  advanced to the the terminal ileum, with                         identification of the appendiceal orifice and IC                         valve. The colonoscopy was somewhat difficult due to                         multiple diverticula in the colon, restricted mobility                         of the colon and the patient's body habitus.                         Successful completion of the procedure was aided by                         applying abdominal pressure and lavage. The patient                         tolerated the procedure well. The quality of the bowel                         preparation was evaluated using the BBPS  [facility] Bowel                         Preparation Scale) with scores of: Right Colon = 2                         (minor amount of residual staining, small fragments of                         stool and/or opaque liquid, but mucosa seen well),                         Transverse Colon = 3 (entire mucosa seen well with no                         residual staining, small fragments of stool or opaque                         liquid) and Left Colon = 2 (minor amount of residual                         staining, small fragments of stool and/or opaque                         liquid, but mucosa seen well). The total BBPS score                         equals 7. The quality of the bowel preparation was                         good. The terminal ileum, ileocecal valve, appendiceal  orifice, and rectum were photographed. Findings:      The perianal and digital rectal examinations were normal. Pertinent       negatives include normal sphincter tone.      The terminal ileum appeared normal. Estimated blood loss: none.      Retroflexion in the right colon was performed.      Multiple small-mouthed diverticula were found in the sigmoid colon.       Estimated blood loss: none.      Non-bleeding internal hemorrhoids were found during retroflexion. The       hemorrhoids were Grade I (internal hemorrhoids that do not prolapse).       Estimated blood loss: none.      The exam was otherwise without abnormality on direct and retroflexion       views. Impression:            - The examined portion of the ileum was normal.                        - Diverticulosis in the sigmoid colon.                        - Non-bleeding internal hemorrhoids.                        - The examination was otherwise normal on direct and                         retroflexion views.                        - No specimens collected. Recommendation:        - Patient has a contact number available for                          emergencies. The signs and symptoms of potential                         delayed complications were discussed with the patient.                         Return to normal activities tomorrow. Written                         discharge instructions were provided to the patient.                        - Discharge patient to home.                        - Resume previous diet.                        - Continue present medications.                        - Repeat colonoscopy in 10 years for screening                         purposes.                        -  Continue iron  therapy. If IDA redevelops, perform                         video capsule endoscopy                        - Return to GI office as previously scheduled.                        - The findings and recommendations were discussed with                         the patient. Procedure Code(s):     --- Professional ---                        [phone], Colonoscopy, flexible; diagnostic, including                         collection of specimen(s) by brushing or washing, when                         performed (separate procedure) Diagnosis Code(s):     --- Professional ---                        K64.0, First degree hemorrhoids                        D50.9, Iron  deficiency anemia, unspecified                        K57.30, Diverticulosis of large intestine without                         perforation or abscess without bleeding CPT copyright 2022 American [employer] Association. All rights reserved. The codes documented in this report are preliminary and upon coder review may  be revised to meet current compliance requirements. Attending Participation:      I personally performed the entire procedure. Elspeth Jungling, DO Elspeth Ozell Jungling DO, DO 06/10/2024 10:57:12 AM This report has been signed electronically. Number of Addenda: 0 Note Initiated On: 06/10/2024 10:00 AM Scope Withdrawal Time: 0 hours 12 minutes 57 seconds  Total  Procedure Duration: 0 hours 23 minutes 23 seconds  Estimated Blood Loss:  Estimated blood loss: none.      [facility]

## 2024-06-10 NOTE — H&P (Signed)
 Pre-Procedure H&P   Patient ID: Bridget Williams is a 68 y.o. female.  Gastroenterology Provider: Elspeth Ozell Jungling, DO  Referring Provider: Delmar Gails, NP PCP: Don Lauraine Collar, NP  Date: 06/10/2024  HPI Ms. Bridget Williams is a 68 y.o. female who presents today for Esophagogastroduodenoscopy and Colonoscopy for iron  deficiency anemia.  Patient reports regular bowel movements without melena or hematochezia.  FOBT negative 45-month ago.  Currently receiving iron  infusions with improvements in anemia and iron  studies.  Hemoglobin went from 7.3 to 12.1  Patient's EGD November 2015 with variable Z-line negative for Barrett's esophagus on biopsy.  Large hiatal hernia and normal duodenum.  Colonoscopy at the same time was incomplete.  Scope was advanced to splenic flexure when there was noted split and procedure was discontinued.  Patient declined CT colonoscopy.  No primary relatives with colon cancer or colon polyps  Status post hysterectomy and ex lap (2017). Ventral hernia repair 2023   Past Medical History:  Diagnosis Date   GERD (gastroesophageal reflux disease)    Hypertension    Hypothyroidism    Large bowel obstruction (HCC)    Obesity    Thyroid  disease    Ventral hernia     Past Surgical History:  Procedure Laterality Date   ABDOMINAL HYSTERECTOMY     BREAST EXCISIONAL BIOPSY Bilateral 1978   INGUINAL HERNIA REPAIR  01/05/2016   Procedure: HERNIA REPAIR INGUINAL INCARCERATED;  Surgeon: Charlie FORBES Fell, MD;  Location: [facility] ORS;  Service: General;;   LAPAROTOMY N/A 01/05/2016   Procedure: EXPLORATORY LAPAROTOMY;  Surgeon: Charlie FORBES Fell, MD;  Location: [facility] ORS;  Service: General;  Laterality: N/A;   VENTRAL HERNIA REPAIR N/A 05/20/2022   Procedure: INCARCERATED VENTRAL HERNIA REPAIR;  Surgeon: Rodolph Romano, MD;  Location: [facility] ORS;  Service: General;  Laterality: N/A;    Family History Maternal aunt- liver cancer Paternal uncle-  crc No other h/o GI disease or malignancy  Review of Systems  Constitutional:  Negative for activity change, appetite change, chills, diaphoresis, fatigue, fever and unexpected weight change.  HENT:  Negative for trouble swallowing and voice change.   Respiratory:  Negative for shortness of breath and wheezing.   Cardiovascular:  Negative for chest pain, palpitations and leg swelling.  Gastrointestinal:  Negative for abdominal distention, abdominal pain, anal bleeding, blood in stool, constipation, diarrhea, nausea, rectal pain and vomiting.  Musculoskeletal:  Negative for arthralgias and myalgias.  Skin:  Negative for color change and pallor.  Neurological:  Negative for dizziness, syncope and weakness.  Psychiatric/Behavioral:  Negative for confusion.   All other systems reviewed and are negative.    Medications No current facility-administered medications on file prior to encounter.   Current Outpatient Medications on File Prior to Encounter  Medication Sig Dispense Refill   aspirin  EC 81 MG tablet Take 81 mg by mouth daily.     diphenhydrAMINE (BENADRYL) 25 mg capsule Take 25 mg by mouth every 6 (six) hours as needed.     DULoxetine  (CYMBALTA ) 60 MG capsule Take 1 capsule by mouth every morning.     levothyroxine  (SYNTHROID ) 137 MCG tablet Take 100 mcg by mouth daily before breakfast.     lisinopril (ZESTRIL) 5 MG tablet Take 5 mg by mouth daily.     Multiple Vitamin (MULTIVITAMIN WITH MINERALS) TABS tablet Take 1 tablet by mouth daily.     pantoprazole  (PROTONIX ) 40 MG tablet Take 40 mg by mouth daily.     rosuvastatin (CRESTOR) 5 MG tablet Take  5 mg by mouth.     sertraline  (ZOLOFT ) 100 MG tablet Take 200 mg by mouth daily.     traZODone  (DESYREL ) 100 MG tablet Take 100 mg by mouth at bedtime as needed.     Vitamin D , Ergocalciferol , (DRISDOL ) 50000 units CAPS capsule Take 50,000 Units by mouth every 30 (thirty) days. Pt takes on the 1st of every month.     acetaminophen   (TYLENOL ) 325 MG tablet Take 2 tablets (650 mg total) by mouth every 6 (six) hours as needed for mild pain (or Fever >/= 101).     hydrOXYzine (VISTARIL) 25 MG capsule Take 1 capsule by mouth daily as needed. (Patient not taking: Reported on 06/01/2024)     polyethylene glycol (MIRALAX  / GLYCOLAX ) 17 g packet Take 17 g by mouth 2 (two) times daily. (Patient not taking: Reported on 06/01/2024) 14 each 0    Pertinent medications related to GI and procedure were reviewed by me with the patient prior to the procedure   Current Facility-Administered Medications:    0.9 %  sodium chloride  infusion, , Intravenous, Continuous, Onita Elspeth Sharper, DO, Last Rate: 20 mL/hr at 06/10/24 0915, New Bag at 06/10/24 0915  sodium chloride  20 mL/hr at 06/10/24 0915       No Known Allergies Allergies were reviewed by me prior to the procedure  Objective   Body mass index is 36.13 kg/m. Vitals:   06/10/24 0912  BP: (!) 144/80  Pulse: (!) 102  Resp: 20  Temp: 97.6 F (36.4 C)  TempSrc: Temporal  SpO2: 98%  Weight: 83.9 kg  Height: 5' (1.524 m)     Physical Exam Vitals and nursing note reviewed.  Constitutional:      General: She is not in acute distress.    Appearance: Normal appearance. She is not ill-appearing, toxic-appearing or diaphoretic.  HENT:     Head: Normocephalic and atraumatic.     Nose: Nose normal.     Mouth/Throat:     Mouth: Mucous membranes are moist.     Pharynx: Oropharynx is clear.  Eyes:     General: No scleral icterus.    Extraocular Movements: Extraocular movements intact.  Cardiovascular:     Rate and Rhythm: Regular rhythm. Tachycardia present.     Heart sounds: Normal heart sounds. No murmur heard.    No friction rub. No gallop.  Pulmonary:     Effort: Pulmonary effort is normal. No respiratory distress.     Breath sounds: Normal breath sounds. No wheezing, rhonchi or rales.  Abdominal:     General: Bowel sounds are normal. There is no distension.      Palpations: Abdomen is soft.     Tenderness: There is no abdominal tenderness. There is no guarding or rebound.  Musculoskeletal:     Cervical back: Neck supple.     Right lower leg: No edema.     Left lower leg: No edema.  Skin:    General: Skin is warm and dry.     Coloration: Skin is not jaundiced or pale.  Neurological:     General: No focal deficit present.     Mental Status: She is alert and oriented to person, place, and time. Mental status is at baseline.  Psychiatric:        Mood and Affect: Mood normal.        Behavior: Behavior normal.        Thought Content: Thought content normal.        Judgment: Judgment  normal.      Assessment:  Ms. Bridget Williams is a 68 y.o. female  who presents today for Esophagogastroduodenoscopy and Colonoscopy for iron  deficiency anemia.  Plan:  Esophagogastroduodenoscopy and Colonoscopy with possible intervention today  Esophagogastroduodenoscopy and Colonoscopy with possible biopsy, control of bleeding, polypectomy, and interventions as necessary has been discussed with the patient/patient representative. Informed consent was obtained from the patient/patient representative after explaining the indication, nature, and risks of the procedure including but not limited to death, bleeding, perforation, missed neoplasm/lesions, cardiorespiratory compromise, and reaction to medications. Opportunity for questions was given and appropriate answers were provided. Patient/patient representative has verbalized understanding is amenable to undergoing the procedure.   Elspeth Ozell Jungling, DO  [facility]  Portions of the record may have been created with voice recognition software. Occasional wrong-word or 'sound-a-like' substitutions may have occurred due to the inherent limitations of voice recognition software.  Read the chart carefully and recognize, using context, where substitutions may have occurred.

## 2024-06-10 NOTE — Transfer of Care (Signed)
 Immediate Anesthesia Transfer of Care Note  Patient: Bridget Williams  Procedure(s) Performed: COLONOSCOPY EGD (ESOPHAGOGASTRODUODENOSCOPY)  Patient Location: Endoscopy Unit  Anesthesia Type:General  Level of Consciousness: drowsy and patient cooperative  Airway & Oxygen Therapy: Patient Spontanous Breathing  Post-op Assessment: Report given to RN  Post vital signs: Reviewed and stable  Last Vitals:  Vitals Value Taken Time  BP 122/74 06/10/24 10:54  Temp 35.9 C 06/10/24 10:54  Pulse 92 06/10/24 10:54  Resp 14 06/10/24 10:54  SpO2 100 % 06/10/24 10:54    Last Pain:  Vitals:   06/10/24 1054  TempSrc: Temporal  PainSc: 0-No pain         Complications: No notable events documented.

## 2024-06-10 NOTE — Anesthesia Procedure Notes (Signed)
 Procedure Name: MAC Date/Time: 06/10/2024 10:12 AM  Performed by: Lorrene Camelia LABOR, CRNAPre-anesthesia Checklist: Patient identified, Emergency Drugs available, Suction available and Patient being monitored Patient Re-evaluated:Patient Re-evaluated prior to induction Oxygen Delivery Method: Simple face mask Preoxygenation: Pre-oxygenation with 100% oxygen Induction Type: IV induction Comments: POM

## 2024-06-10 NOTE — Op Note (Signed)
 [facility] Gastroenterology Patient Name: Bridget Williams Procedure Date: 06/10/2024 10:02 AM MRN: [id number] Account #: [id number] Date of Birth: 08-Apr-1956 Admit Type: Outpatient Age: 68 Room: [facility] ENDO ROOM 1 Gender: Female Note Status: Finalized Instrument Name: Upper GI Scope [id number] Procedure:             Upper GI endoscopy Indications:           Iron  deficiency anemia Providers:             Elspeth Ozell Jungling DO, DO Referring MD:          Lauraine LOIS Leak (Referring MD) Medicines:             Monitored Anesthesia Care Complications:         No immediate complications. Estimated blood loss:                         Minimal. Procedure:             Pre-Anesthesia Assessment:                        - Prior to the procedure, a History and Physical was                         performed, and patient medications and allergies were                         reviewed. The patient is competent. The risks and                         benefits of the procedure and the sedation options and                         risks were discussed with the patient. All questions                         were answered and informed consent was obtained.                         Patient identification and proposed procedure were                         verified by the physician, the nurse, the anesthetist                         and the technician in the endoscopy suite. Mental                         Status Examination: alert and oriented. Airway                         Examination: normal oropharyngeal airway and neck                         mobility. Respiratory Examination: clear to                         auscultation. CV Examination: RRR, no murmurs, no S3  or S4. Prophylactic Antibiotics: The patient does not                         require prophylactic antibiotics. Prior                         Anticoagulants: The patient has taken no anticoagulant                          or antiplatelet agents. ASA Grade Assessment: II - A                         patient with mild systemic disease. After reviewing                         the risks and benefits, the patient was deemed in                         satisfactory condition to undergo the procedure. The                         anesthesia plan was to use monitored anesthesia care                         (MAC). Immediately prior to administration of                         medications, the patient was re-assessed for adequacy                         to receive sedatives. The heart rate, respiratory                         rate, oxygen saturations, blood pressure, adequacy of                         pulmonary ventilation, and response to care were                         monitored throughout the procedure. The physical                         status of the patient was re-assessed after the                         procedure.                        After obtaining informed consent, the endoscope was                         passed under direct vision. Throughout the procedure,                         the patient's blood pressure, pulse, and oxygen                         saturations were monitored continuously. The Endoscope  was introduced through the mouth, and advanced to the                         third part of duodenum. The upper GI endoscopy was                         accomplished without difficulty. The patient tolerated                         the procedure well. Findings:      The duodenal bulb, first portion of the duodenum, second portion of the       duodenum and third portion of the duodenum were normal. Biopsies for       histology were taken with a cold forceps for evaluation of celiac       disease. Estimated blood loss was minimal.      A 7 cm hiatal hernia was present. Estimated blood loss: none.      The exam of the stomach was otherwise normal.      Normal mucosa  was found in the entire examined stomach. Biopsies were       taken with a cold forceps for Helicobacter pylori testing. Estimated       blood loss was minimal.      The Z-line was regular. Estimated blood loss: none.      Esophagogastric landmarks were identified: the gastroesophageal junction       was found at 28 cm from the incisors.      The exam of the esophagus was otherwise normal. Impression:            - Normal duodenal bulb, first portion of the duodenum,                         second portion of the duodenum and third portion of                         the duodenum. Biopsied.                        - 7 cm hiatal hernia.                        - Normal mucosa was found in the entire stomach.                         Biopsied.                        - Z-line regular.                        - Esophagogastric landmarks identified. Recommendation:        - Patient has a contact number available for                         emergencies. The signs and symptoms of potential                         delayed complications were discussed with the patient.  Return to normal activities tomorrow. Written                         discharge instructions were provided to the patient.                        - Discharge patient to home.                        - Resume previous diet.                        - Continue present medications.                        - Await pathology results.                        - Return to GI clinic as previously scheduled.                        - The findings and recommendations were discussed with                         the patient. Procedure Code(s):     --- Professional ---                        [phone], Esophagogastroduodenoscopy, flexible,                         transoral; with biopsy, single or multiple Diagnosis Code(s):     --- Professional ---                        K44.9, Diaphragmatic hernia without obstruction or                          gangrene                        D50.9, Iron  deficiency anemia, unspecified CPT copyright 2022 American Medical Association. All rights reserved. The codes documented in this report are preliminary and upon coder review may  be revised to meet current compliance requirements. Attending Participation:      I personally performed the entire procedure. Elspeth Jungling, DO Elspeth Ozell Jungling DO, DO 06/10/2024 10:23:44 AM This report has been signed electronically. Number of Addenda: 0 Note Initiated On: 06/10/2024 10:02 AM Estimated Blood Loss:  Estimated blood loss was minimal.      [facility]

## 2024-06-10 NOTE — Anesthesia Postprocedure Evaluation (Signed)
 Anesthesia Post Note  Patient: Bridget Williams  Procedure(s) Performed: COLONOSCOPY EGD (ESOPHAGOGASTRODUODENOSCOPY)  Patient location during evaluation: Endoscopy Anesthesia Type: General Level of consciousness: awake and alert Pain management: pain level controlled Vital Signs Assessment: post-procedure vital signs reviewed and stable Respiratory status: spontaneous breathing, nonlabored ventilation, respiratory function stable and patient connected to nasal cannula oxygen Cardiovascular status: blood pressure returned to baseline and stable Postop Assessment: no apparent nausea or vomiting Anesthetic complications: no   No notable events documented.   Last Vitals:  Vitals:   06/10/24 1104 06/10/24 1114  BP: 131/82 136/78  Pulse: 88   Resp: 16   Temp:    SpO2:  98%    Last Pain:  Vitals:   06/10/24 1104  TempSrc:   PainSc: 0-No pain                 Prentice Murphy

## 2024-06-11 ENCOUNTER — Encounter: Payer: Self-pay | Admitting: Gastroenterology

## 2024-06-11 LAB — SURGICAL PATHOLOGY

## 2024-10-01 ENCOUNTER — Inpatient Hospital Stay

## 2024-10-04 ENCOUNTER — Inpatient Hospital Stay

## 2024-10-04 ENCOUNTER — Telehealth: Payer: Self-pay | Admitting: Oncology

## 2024-10-04 NOTE — Telephone Encounter (Signed)
 Pt was scheduled for lab today and MD/iron  tomorrow. Pt needed to r/s due to watching her grandson since the schools closed due to weather. Pt has been r/s to next available MD and new dates times for all appts confirmed with pt. Pt declined mailing the AVS

## 2024-10-05 ENCOUNTER — Inpatient Hospital Stay: Admitting: Oncology

## 2024-10-05 ENCOUNTER — Inpatient Hospital Stay

## 2024-11-02 ENCOUNTER — Inpatient Hospital Stay: Attending: Oncology

## 2024-11-02 DIAGNOSIS — Z79899 Other long term (current) drug therapy: Secondary | ICD-10-CM | POA: Diagnosis not present

## 2024-11-02 DIAGNOSIS — D509 Iron deficiency anemia, unspecified: Secondary | ICD-10-CM | POA: Insufficient documentation

## 2024-11-02 DIAGNOSIS — Z7982 Long term (current) use of aspirin: Secondary | ICD-10-CM | POA: Diagnosis not present

## 2024-11-02 DIAGNOSIS — Z803 Family history of malignant neoplasm of breast: Secondary | ICD-10-CM | POA: Diagnosis not present

## 2024-11-02 LAB — IRON AND TIBC
Iron: 56 ug/dL (ref 28–170)
Saturation Ratios: 16 % (ref 10.4–31.8)
TIBC: 356 ug/dL (ref 250–450)
UIBC: 299 ug/dL

## 2024-11-02 LAB — FERRITIN: Ferritin: 33 ng/mL (ref 11–307)

## 2024-11-02 LAB — CBC WITH DIFFERENTIAL (CANCER CENTER ONLY)
Abs Immature Granulocytes: 0.09 K/uL — ABNORMAL HIGH (ref 0.00–0.07)
Basophils Absolute: 0.1 K/uL (ref 0.0–0.1)
Basophils Relative: 1 %
Eosinophils Absolute: 0.3 K/uL (ref 0.0–0.5)
Eosinophils Relative: 4 %
HCT: 45.4 % (ref 36.0–46.0)
Hemoglobin: 14.4 g/dL (ref 12.0–15.0)
Immature Granulocytes: 1 %
Lymphocytes Relative: 22 %
Lymphs Abs: 1.5 K/uL (ref 0.7–4.0)
MCH: 27.3 pg (ref 26.0–34.0)
MCHC: 31.7 g/dL (ref 30.0–36.0)
MCV: 86 fL (ref 80.0–100.0)
Monocytes Absolute: 0.5 K/uL (ref 0.1–1.0)
Monocytes Relative: 7 %
Neutro Abs: 4.6 K/uL (ref 1.7–7.7)
Neutrophils Relative %: 65 %
Platelet Count: 256 K/uL (ref 150–400)
RBC: 5.28 MIL/uL — ABNORMAL HIGH (ref 3.87–5.11)
RDW: 14.5 % (ref 11.5–15.5)
WBC Count: 7.1 K/uL (ref 4.0–10.5)
nRBC: 0 % (ref 0.0–0.2)

## 2024-11-02 LAB — RETIC PANEL
Immature Retic Fract: 14.9 % (ref 2.3–15.9)
RBC.: 5.23 MIL/uL — ABNORMAL HIGH (ref 3.87–5.11)
Retic Count, Absolute: 85.2 K/uL (ref 19.0–186.0)
Retic Ct Pct: 1.6 % (ref 0.4–3.1)
Reticulocyte Hemoglobin: 29.9 pg

## 2024-11-08 ENCOUNTER — Inpatient Hospital Stay: Admitting: Oncology

## 2024-11-08 ENCOUNTER — Encounter: Payer: Self-pay | Admitting: Oncology

## 2024-11-08 ENCOUNTER — Inpatient Hospital Stay

## 2024-11-08 VITALS — BP 140/84 | HR 91 | Temp 97.7°F | Resp 18 | Wt 186.9 lb

## 2024-11-08 DIAGNOSIS — D509 Iron deficiency anemia, unspecified: Secondary | ICD-10-CM

## 2024-11-08 NOTE — Assessment & Plan Note (Addendum)
 Lab results are consistent with iron  deficiency anemia.  Lab Results  Component Value Date   HGB 14.4 11/02/2024   TIBC 356 11/02/2024   IRONPCTSAT 16 11/02/2024   FERRITIN 33 11/02/2024    Both hemoglobin and iron  panel have improved. Hold off IV Venofer .

## 2024-11-08 NOTE — Progress Notes (Signed)
 " Hematology/Oncology Consult note [phone] [phone]        REFERRING PROVIDER: Don Lauraine Collar, NP   CHIEF COMPLAINTS/REASON FOR VISIT:  Evaluation of iron  deficiency anemia.    ASSESSMENT & PLAN:   Iron  deficiency anemia Lab results are consistent with iron  deficiency anemia.  Lab Results  Component Value Date   HGB 14.4 11/02/2024   TIBC 356 11/02/2024   IRONPCTSAT 16 11/02/2024   FERRITIN 33 11/02/2024    Both hemoglobin and iron  panel have improved. Hold off IV Venofer .     No orders of the defined types were placed in this encounter. Follow-up as needed. All questions were answered. The patient knows to call the clinic with any problems, questions or concerns.  Zelphia Cap, MD, PhD [facility] Hematology Oncology 11/08/2024   HISTORY OF PRESENTING ILLNESS:   Bridget Williams is a  69 y.o.  female with PMH listed below was seen in consultation at the request of  Gauger, Lauraine Collar, NP  for evaluation of iron  deficiency anemia.   03/16/2024 cbc showed hemoglobin of 7.3, MCV 57.5 03/26/2024 iron  panel showed ferritin 14, TIBC 11, iron  saturation 11 She has iron  deficiency anemia and has not completed the fecal occult blood test she recently obtained. Her last colonoscopy in 2015 was incomplete due to a hernia obstructing the passage. She has a history of incarcerated hernia.  She is currently taking 81 mg of aspirin  daily and occasionally uses over-the-counter NSAIDS. No current stomach pain is reported, but she has a history of acid reflux, which is well controlled. She has not undergone gastric bypass surgery.  In 2007, she received iron  infusions prior to a hysterectomy due to heavy menstrual bleeding and tolerated the infusions well. She currently feels tired, dizzy, and drained, and has been craving ice  INTERVAL HISTORY Bridget Williams is a 69 y.o. female who has above history reviewed by me today presents for follow up visit  for Iron  deficiency anemia Patient tolerated IV Venofer  treatments.  Some improvement of chronic fatigue. 06/10/2024, status post upper endoscopy and colonoscopy.  MEDICAL HISTORY:  Past Medical History:  Diagnosis Date   GERD (gastroesophageal reflux disease)    Hypertension    Hypothyroidism    Large bowel obstruction (HCC)    Obesity    Thyroid  disease    Ventral hernia     SURGICAL HISTORY: Past Surgical History:  Procedure Laterality Date   ABDOMINAL HYSTERECTOMY     BREAST EXCISIONAL BIOPSY Bilateral 1978   COLONOSCOPY N/A 06/10/2024   Procedure: COLONOSCOPY;  Surgeon: Onita Elspeth Sharper, DO;  Location: [facility] ENDOSCOPY;  Service: Gastroenterology;  Laterality: N/A;   ESOPHAGOGASTRODUODENOSCOPY N/A 06/10/2024   Procedure: EGD (ESOPHAGOGASTRODUODENOSCOPY);  Surgeon: Onita Elspeth Sharper, DO;  Location: [facility] ENDOSCOPY;  Service: Gastroenterology;  Laterality: N/A;   INGUINAL HERNIA REPAIR  01/05/2016   Procedure: HERNIA REPAIR INGUINAL INCARCERATED;  Surgeon: Charlie FORBES Fell, MD;  Location: [facility] ORS;  Service: General;;   LAPAROTOMY N/A 01/05/2016   Procedure: EXPLORATORY LAPAROTOMY;  Surgeon: Charlie FORBES Fell, MD;  Location: [facility] ORS;  Service: General;  Laterality: N/A;   VENTRAL HERNIA REPAIR N/A 05/20/2022   Procedure: INCARCERATED VENTRAL HERNIA REPAIR;  Surgeon: Rodolph Romano, MD;  Location: [facility] ORS;  Service: General;  Laterality: N/A;    SOCIAL HISTORY: Social History   Socioeconomic History   Marital status: Widowed    Spouse name: Not on file   Number of children: Not on file   Years of education:  Not on file   Highest education level: Not on file  Occupational History   Not on file  Tobacco Use   Smoking status: Never   Smokeless tobacco: Never  Substance and Sexual Activity   Alcohol use: No    Alcohol/week: 0.0 standard drinks of alcohol   Drug use: No   Sexual activity: Not on file  Other Topics Concern   Not on file  Social History  Narrative   Not on file   Social Drivers of Health   Tobacco Use: Low Risk (11/08/2024)   Patient History    Smoking Tobacco Use: Never    Smokeless Tobacco Use: Never    Passive Exposure: Not on file  Financial Resource Strain: Medium Risk (03/16/2024)   Received from [facility]   Overall Financial Resource Strain (CARDIA)    Difficulty of Paying Living Expenses: Somewhat hard  Food Insecurity: No Food Insecurity (04/06/2024)   Hunger Vital Sign    Worried About Running Out of Food in the Last Year: Never true    Ran Out of Food in the Last Year: Never true  Recent Concern: Food Insecurity - Food Insecurity Present (03/16/2024)   Received from [facility]   Epic    Within the past 12 months, you worried that your food would run out before you got the money to buy more.: Sometimes true    Within the past 12 months, the food you bought just didn't last and you didn't have money to get more.: Often true  Transportation Needs: No Transportation Needs (03/16/2024)   Received from [facility] - Transportation    In the past 12 months, has lack of transportation kept you from medical appointments or from getting medications?: No    Lack of Transportation (Non-Medical): No  Physical Activity: Not on file  Stress: Not on file  Social Connections: Not on file  Intimate Partner Violence: Not on file  Depression (PHQ2-9): Low Risk (04/20/2024)   Depression (PHQ2-9)    PHQ-2 Score: 0  Alcohol Screen: Not on file  Housing: Low Risk  (03/31/2024)   Received from [facility]   Epic    In the last 12 months, was there a time when you were not able to pay the mortgage or rent on time?: No    In the past 12 months, how many times have you moved where you were living?: 0    At any time in the past 12 months, were you homeless or living in a shelter (including now)?: No  Utilities: Not At Risk (03/16/2024)   Received  from [facility] Utilities    Threatened with loss of utilities: No  Health Literacy: Not on file    FAMILY HISTORY: Family History  Problem Relation Age of Onset   Breast cancer Mother 58   Breast cancer Paternal Aunt     ALLERGIES:  has no known allergies.  MEDICATIONS:  Current Outpatient Medications  Medication Sig Dispense Refill   acetaminophen  (TYLENOL ) 325 MG tablet Take 2 tablets (650 mg total) by mouth every 6 (six) hours as needed for mild pain (or Fever >/= 101).     aspirin  EC 81 MG tablet Take 81 mg by mouth daily.     diphenhydrAMINE (BENADRYL) 25 mg capsule Take 25 mg by mouth every 6 (six) hours as needed.     DULoxetine  (CYMBALTA ) 60 MG capsule Take 1 capsule  by mouth every morning.     levothyroxine  (SYNTHROID ) 137 MCG tablet Take 100 mcg by mouth daily before breakfast.     lisinopril (ZESTRIL) 5 MG tablet Take 5 mg by mouth daily.     Multiple Vitamin (MULTIVITAMIN WITH MINERALS) TABS tablet Take 1 tablet by mouth daily.     pantoprazole  (PROTONIX ) 40 MG tablet Take 40 mg by mouth daily.     rosuvastatin (CRESTOR) 5 MG tablet Take 5 mg by mouth.     sertraline  (ZOLOFT ) 100 MG tablet Take 200 mg by mouth daily.     traZODone  (DESYREL ) 100 MG tablet Take 100 mg by mouth at bedtime as needed.     Vitamin D , Ergocalciferol , (DRISDOL ) 50000 units CAPS capsule Take 50,000 Units by mouth every 30 (thirty) days. Pt takes on the 1st of every month.     hydrOXYzine (VISTARIL) 25 MG capsule Take 1 capsule by mouth daily as needed. (Patient not taking: Reported on 11/08/2024)     polyethylene glycol (MIRALAX  / GLYCOLAX ) 17 g packet Take 17 g by mouth 2 (two) times daily. (Patient not taking: Reported on 11/08/2024) 14 each 0   No current facility-administered medications for this visit.    Review of Systems  Constitutional:  Negative for appetite change, chills, fatigue and fever.  HENT:   Negative for hearing loss and voice change.   Eyes:   Negative for eye problems.  Respiratory:  Negative for chest tightness and cough.   Cardiovascular:  Negative for chest pain.  Gastrointestinal:  Negative for abdominal distention, abdominal pain, blood in stool, nausea and vomiting.  Endocrine: Negative for hot flashes.  Genitourinary:  Negative for difficulty urinating and frequency.   Musculoskeletal:  Negative for arthralgias.  Skin:  Negative for itching and rash.  Neurological:  Negative for extremity weakness.  Hematological:  Negative for adenopathy.  Psychiatric/Behavioral:  Negative for confusion.    PHYSICAL EXAMINATION: ECOG PERFORMANCE STATUS: 1 - Symptomatic but completely ambulatory Vitals:   11/08/24 1358  BP: (!) 140/84  Pulse: 91  Resp: 18  Temp: 97.7 F (36.5 C)  SpO2: 96%   Filed Weights   11/08/24 1358  Weight: 186 lb 14.4 oz (84.8 kg)    Physical Exam Constitutional:      General: She is not in acute distress. HENT:     Head: Normocephalic and atraumatic.  Eyes:     General: No scleral icterus. Cardiovascular:     Rate and Rhythm: Normal rate.  Pulmonary:     Effort: Pulmonary effort is normal. No respiratory distress.  Abdominal:     General: There is no distension.  Musculoskeletal:        General: No deformity.     Cervical back: Normal range of motion and neck supple.  Skin:    Findings: No erythema or rash.  Neurological:     Mental Status: She is alert and oriented to person, place, and time. Mental status is at baseline.  Psychiatric:        Mood and Affect: Mood normal.     LABORATORY DATA:  I have reviewed the data as listed    Latest Ref Rng & Units 11/02/2024   10:46 AM 05/28/2024   10:56 AM 05/24/2022    4:50 AM  CBC  WBC 4.0 - 10.5 K/uL 7.1  6.6  14.4   Hemoglobin 12.0 - 15.0 g/dL 85.5  87.8  89.9   Hematocrit 36.0 - 46.0 % 45.4  40.6  32.8   Platelets  150 - 400 K/uL 256  180  370       Latest Ref Rng & Units 05/21/2022    4:41 AM 05/20/2022    1:31 PM 01/07/2016    8:43  AM  CMP  Glucose 70 - 99 mg/dL 884  888  881   BUN 8 - 23 mg/dL 13  12  11    Creatinine 0.44 - 1.00 mg/dL 9.06  8.89  9.04   Sodium 135 - 145 mmol/L 140  143  140   Potassium 3.5 - 5.1 mmol/L 4.1  4.3  3.9   Chloride 98 - 111 mmol/L 108  103  106   CO2 22 - 32 mmol/L 23  29  26    Calcium 8.9 - 10.3 mg/dL 8.2  9.6  8.4   Total Protein 6.5 - 8.1 g/dL  7.8    Total Bilirubin 0.3 - 1.2 mg/dL  1.1    Alkaline Phos 38 - 126 U/L  104    AST 15 - 41 U/L  24    ALT 0 - 44 U/L  24        RADIOGRAPHIC STUDIES: I have personally reviewed the radiological images as listed and agreed with the findings in the report. No results found.       "
